# Patient Record
Sex: Female | Born: 1995 | Race: Black or African American | Hispanic: No | Marital: Single | Smoking: Former smoker
Health system: Southern US, Community
[De-identification: ages and names within clinical notes are randomized; demographics above are authoritative.]

## PROBLEM LIST (undated history)

## (undated) ENCOUNTER — Inpatient Hospital Stay (HOSPITAL_COMMUNITY): Payer: Self-pay

## (undated) DIAGNOSIS — O2303 Infections of kidney in pregnancy, third trimester: Secondary | ICD-10-CM

## (undated) DIAGNOSIS — O409XX0 Polyhydramnios, unspecified trimester, not applicable or unspecified: Secondary | ICD-10-CM

## (undated) DIAGNOSIS — E1065 Type 1 diabetes mellitus with hyperglycemia: Secondary | ICD-10-CM

## (undated) DIAGNOSIS — IMO0002 Reserved for concepts with insufficient information to code with codable children: Secondary | ICD-10-CM

## (undated) DIAGNOSIS — R011 Cardiac murmur, unspecified: Secondary | ICD-10-CM

## (undated) DIAGNOSIS — I10 Essential (primary) hypertension: Secondary | ICD-10-CM

## (undated) DIAGNOSIS — G43909 Migraine, unspecified, not intractable, without status migrainosus: Secondary | ICD-10-CM

## (undated) DIAGNOSIS — J45909 Unspecified asthma, uncomplicated: Secondary | ICD-10-CM

## (undated) HISTORY — DX: Polyhydramnios, unspecified trimester, not applicable or unspecified: O40.9XX0

## (undated) HISTORY — DX: Essential (primary) hypertension: I10

## (undated) HISTORY — DX: Migraine, unspecified, not intractable, without status migrainosus: G43.909

## (undated) HISTORY — DX: Infections of kidney in pregnancy, third trimester: O23.03

---

## 2017-07-23 DIAGNOSIS — O26892 Other specified pregnancy related conditions, second trimester: Secondary | ICD-10-CM

## 2017-07-23 DIAGNOSIS — Z6791 Unspecified blood type, Rh negative: Secondary | ICD-10-CM | POA: Insufficient documentation

## 2017-10-07 ENCOUNTER — Encounter (HOSPITAL_COMMUNITY): Payer: Self-pay

## 2017-10-07 ENCOUNTER — Encounter (HOSPITAL_COMMUNITY): Payer: Self-pay | Admitting: *Deleted

## 2017-10-07 ENCOUNTER — Emergency Department (HOSPITAL_COMMUNITY)
Admission: EM | Admit: 2017-10-07 | Discharge: 2017-10-07 | Disposition: A | Discharge status: Other Acute Inpatient Hospital | Payer: Medicaid Other | Attending: Emergency Medicine | Admitting: Emergency Medicine

## 2017-10-07 ENCOUNTER — Other Ambulatory Visit: Payer: Self-pay

## 2017-10-07 ENCOUNTER — Inpatient Hospital Stay (HOSPITAL_COMMUNITY)
Admission: AD | Admit: 2017-10-07 | Discharge: 2017-10-09 | Disposition: A | Discharge status: Home / Self Care | Payer: Medicaid Other | Source: Ambulatory Visit | Attending: Obstetrics and Gynecology | Admitting: Obstetrics and Gynecology

## 2017-10-07 DIAGNOSIS — O2340 Unspecified infection of urinary tract in pregnancy, unspecified trimester: Secondary | ICD-10-CM | POA: Diagnosis present

## 2017-10-07 DIAGNOSIS — E1065 Type 1 diabetes mellitus with hyperglycemia: Secondary | ICD-10-CM

## 2017-10-07 DIAGNOSIS — Z6791 Unspecified blood type, Rh negative: Secondary | ICD-10-CM

## 2017-10-07 DIAGNOSIS — O9989 Other specified diseases and conditions complicating pregnancy, childbirth and the puerperium: Secondary | ICD-10-CM | POA: Insufficient documentation

## 2017-10-07 DIAGNOSIS — Z363 Encounter for antenatal screening for malformations: Secondary | ICD-10-CM

## 2017-10-07 DIAGNOSIS — O24919 Unspecified diabetes mellitus in pregnancy, unspecified trimester: Secondary | ICD-10-CM

## 2017-10-07 DIAGNOSIS — O99212 Obesity complicating pregnancy, second trimester: Secondary | ICD-10-CM

## 2017-10-07 DIAGNOSIS — O24012 Pre-existing diabetes mellitus, type 1, in pregnancy, second trimester: Secondary | ICD-10-CM

## 2017-10-07 DIAGNOSIS — O99511 Diseases of the respiratory system complicating pregnancy, first trimester: Secondary | ICD-10-CM | POA: Insufficient documentation

## 2017-10-07 DIAGNOSIS — IMO0001 Reserved for inherently not codable concepts without codable children: Secondary | ICD-10-CM | POA: Diagnosis present

## 2017-10-07 DIAGNOSIS — Z3A24 24 weeks gestation of pregnancy: Secondary | ICD-10-CM

## 2017-10-07 DIAGNOSIS — O26899 Other specified pregnancy related conditions, unspecified trimester: Secondary | ICD-10-CM

## 2017-10-07 DIAGNOSIS — E1010 Type 1 diabetes mellitus with ketoacidosis without coma: Secondary | ICD-10-CM | POA: Diagnosis present

## 2017-10-07 DIAGNOSIS — O99210 Obesity complicating pregnancy, unspecified trimester: Secondary | ICD-10-CM | POA: Diagnosis present

## 2017-10-07 DIAGNOSIS — G43909 Migraine, unspecified, not intractable, without status migrainosus: Secondary | ICD-10-CM | POA: Diagnosis present

## 2017-10-07 DIAGNOSIS — E1069 Type 1 diabetes mellitus with other specified complication: Secondary | ICD-10-CM

## 2017-10-07 DIAGNOSIS — O0990 Supervision of high risk pregnancy, unspecified, unspecified trimester: Secondary | ICD-10-CM

## 2017-10-07 DIAGNOSIS — O409XX0 Polyhydramnios, unspecified trimester, not applicable or unspecified: Secondary | ICD-10-CM | POA: Diagnosis present

## 2017-10-07 HISTORY — DX: Cardiac murmur, unspecified: R01.1

## 2017-10-07 HISTORY — DX: Type 1 diabetes mellitus with hyperglycemia: E10.65

## 2017-10-07 HISTORY — DX: Reserved for concepts with insufficient information to code with codable children: IMO0002

## 2017-10-07 HISTORY — DX: Unspecified asthma, uncomplicated: J45.909

## 2017-10-07 LAB — CBC
HEMATOCRIT: 35.9 % — AB (ref 36.0–46.0)
Hemoglobin: 11.9 g/dL — ABNORMAL LOW (ref 12.0–15.0)
MCH: 29.5 pg (ref 26.0–34.0)
MCHC: 33.1 g/dL (ref 30.0–36.0)
MCV: 88.9 fL (ref 78.0–100.0)
Platelets: 229 10*3/uL (ref 150–400)
RBC: 4.04 MIL/uL (ref 3.87–5.11)
RDW: 12.7 % (ref 11.5–15.5)
WBC: 9.1 10*3/uL (ref 4.0–10.5)

## 2017-10-07 LAB — URINALYSIS, ROUTINE W REFLEX MICROSCOPIC
BILIRUBIN URINE: NEGATIVE
Glucose, UA: >=500 mg/dL — AB
Hgb urine dipstick: NEGATIVE
KETONES UR: 80 mg/dL — AB
LEUKOCYTES UA: NEGATIVE
Nitrite: NEGATIVE
Protein, ur: NEGATIVE mg/dL
SPECIFIC GRAVITY, URINE: 1.03 (ref 1.005–1.030)
pH: 6 (ref 5.0–8.0)

## 2017-10-07 LAB — CBG MONITORING, ED
GLUCOSE-CAPILLARY: 283 mg/dL — AB (ref 70–99)
Glucose-Capillary: 336 mg/dL — ABNORMAL HIGH (ref 70–99)

## 2017-10-07 LAB — GLUCOSE, CAPILLARY
GLUCOSE-CAPILLARY: 163 mg/dL — AB (ref 70–99)
GLUCOSE-CAPILLARY: 150 mg/dL — AB (ref 70–99)
GLUCOSE-CAPILLARY: 151 mg/dL — AB (ref 70–99)

## 2017-10-07 LAB — BASIC METABOLIC PANEL
Anion gap: 11 (ref 5–15)
BUN: 10 mg/dL (ref 6–20)
CO2: 20 mmol/L — AB (ref 22–32)
Calcium: 8.6 mg/dL — ABNORMAL LOW (ref 8.9–10.3)
Chloride: 103 mmol/L (ref 98–111)
Creatinine, Ser: 0.59 mg/dL (ref 0.44–1.00)
GFR calc Af Amer: >60 mL/min (ref >60)
GFR calc non Af Amer: >60 mL/min (ref >60)
GLUCOSE: 354 mg/dL — AB (ref 70–99)
POTASSIUM: 4 mmol/L (ref 3.5–5.1)
Sodium: 134 mmol/L — ABNORMAL LOW (ref 135–145)

## 2017-10-07 LAB — I-STAT BETA HCG BLOOD, ED (MC, WL, AP ONLY)

## 2017-10-07 MED ORDER — INSULIN ASPART 100 UNIT/ML ~~LOC~~ SOLN
0.0000 [IU] | Freq: Three times a day (TID) | SUBCUTANEOUS | Status: DC
  Administered 2017-10-07: 4 [IU] via SUBCUTANEOUS
  Administered 2017-10-08 (×3): 3 [IU] via SUBCUTANEOUS
  Administered 2017-10-09: 5 [IU] via SUBCUTANEOUS

## 2017-10-07 MED ORDER — SODIUM CHLORIDE 0.9 % IV BOLUS
1000.0000 mL | Freq: Once | INTRAVENOUS | Status: AC
  Administered 2017-10-07: 1000 mL via INTRAVENOUS

## 2017-10-07 MED ORDER — CALCIUM CARBONATE ANTACID 500 MG PO CHEW: 2.0000 | CHEWABLE_TABLET | ORAL | Status: DC | PRN

## 2017-10-07 MED ORDER — SODIUM CHLORIDE 0.9 % IV SOLN
INTRAVENOUS | Status: DC
  Administered 2017-10-08: 02:00:00 via INTRAVENOUS

## 2017-10-07 MED ORDER — INSULIN GLARGINE 100 UNIT/ML ~~LOC~~ SOLN
28.0000 [IU] | Freq: Every day | SUBCUTANEOUS | Status: DC
  Administered 2017-10-07 – 2017-10-08 (×2): 28 [IU] via SUBCUTANEOUS
  Filled 2017-10-07 (×3): qty 0.28

## 2017-10-07 MED ORDER — PRENATAL MULTIVITAMIN CH
1.0000 | ORAL_TABLET | Freq: Every day | ORAL | Status: DC
  Administered 2017-10-08 – 2017-10-09 (×2): 1 via ORAL
  Filled 2017-10-07 (×2): qty 1

## 2017-10-07 MED ORDER — INSULIN ASPART 100 UNIT/ML ~~LOC~~ SOLN: 0.0000 [IU] | Freq: Three times a day (TID) | SUBCUTANEOUS | Status: DC

## 2017-10-07 MED ORDER — DOCUSATE SODIUM 100 MG PO CAPS: 100.0000 mg | ORAL_CAPSULE | Freq: Every day | ORAL | Status: DC

## 2017-10-07 MED ORDER — INSULIN ASPART 100 UNIT/ML ~~LOC~~ SOLN
8.0000 [IU] | Freq: Once | SUBCUTANEOUS | Status: AC
  Administered 2017-10-07: 8 [IU] via INTRAVENOUS
  Filled 2017-10-07: qty 1

## 2017-10-07 MED ORDER — ACETAMINOPHEN 325 MG PO TABS: 650.0000 mg | ORAL_TABLET | ORAL | Status: DC | PRN

## 2017-10-07 NOTE — Progress Notes (Signed)
Patient called nurse into room due to feeling weak and shakey. Checked blood sugar and it was 150. Will reassess patient in a hour to see if theres any change.

## 2017-10-07 NOTE — ED Provider Notes (Signed)
[facility] EMERGENCY DEPARTMENT Provider Note   CSN: [id number] Arrival date & time: 10/07/17  1353     History   Chief Complaint Chief Complaint  Patient presents with  . Hyperglycemia    HPI [name] [name] is a 22 y.o. female.  Pt presents to the ED today with elevated blood sugars and pregnancy.  Pt has been a type 1 diabetic for several years.  This is her first pregnancy.  She is [redacted] weeks along.  She has had poor control throughout her pregnancy per notes in Epic.  She is followed at [facility] as she used to live in that area.  She has recently moved to [place] and does not have an obgyn here.  The pt denies any abdominal pain or vaginal bleeding.  She is feeling her baby move.  She does have an endocrinologist at [facility], but last saw the endocrinologist in May.     Past Medical History:  Diagnosis Date  . Asthma   . Diabetes type 1, uncontrolled ([place])   . Heart murmur     There are no active problems to display for this patient.   History reviewed. No pertinent surgical history.   OB History    Gravida  1   Para      Term      Preterm      AB      Living        SAB      TAB      Ectopic      Multiple      Live Births               Home Medications    Prior to Admission medications   Not on File    Family History History reviewed. No pertinent family history.  Social History Social History   Tobacco Use  . Smoking status: Never Smoker  . Smokeless tobacco: Never Used  Substance Use Topics  . Alcohol use: Not on file  . Drug use: Not on file     Allergies   Patient has no known allergies.   Review of Systems Review of Systems  Endocrine: Positive for polydipsia and polyuria.  All other systems reviewed and are negative.    Physical Exam Updated Vital Signs BP 125/68   Pulse 92   Temp 99 F (37.2 C) (Oral)   Resp 18   SpO2 99%   Physical Exam  Constitutional: She is oriented to person, place,  and time. She appears well-developed and well-nourished.  HENT:  Head: Normocephalic and atraumatic.  Right Ear: External ear normal.  Left Ear: External ear normal.  Nose: Nose normal.  Mouth/Throat: Mucous membranes are dry.  Eyes: Pupils are equal, round, and reactive to light. Conjunctivae and EOM are normal.  Neck: Normal range of motion. Neck supple.  Cardiovascular: Normal rate, regular rhythm, normal heart sounds and intact distal pulses.  Pulmonary/Chest: Effort normal and breath sounds normal.  Abdominal: Soft. Bowel sounds are normal.  Gravid abdomen  Musculoskeletal: Normal range of motion.  Neurological: She is alert and oriented to person, place, and time.  Skin: Skin is warm. Capillary refill takes less than 2 seconds.  Psychiatric: She has a normal mood and affect. Her behavior is normal. Judgment and thought content normal.  Nursing note and vitals reviewed.    ED Treatments / Results  Labs (all labs ordered are listed, but only abnormal results are displayed) Labs Reviewed  BASIC METABOLIC  PANEL - Abnormal; Notable for the following components:      Result Value   Sodium 134 (*)    CO2 20 (*)    Glucose, Bld 354 (*)    Calcium 8.6 (*)    All other components within normal limits  CBC - Abnormal; Notable for the following components:   Hemoglobin 11.9 (*)    HCT 35.9 (*)    All other components within normal limits  URINALYSIS, ROUTINE W REFLEX MICROSCOPIC - Abnormal; Notable for the following components:   Glucose, UA >=500 (*)    Ketones, ur 80 (*)    Bacteria, UA RARE (*)    All other components within normal limits  CBG MONITORING, ED - Abnormal; Notable for the following components:   Glucose-Capillary 336 (*)    All other components within normal limits  I-STAT BETA HCG BLOOD, ED ([facility], [facility], AP ONLY) - Abnormal; Notable for the following components:   I-stat hCG, quantitative >2,000.0 (*)    All other components within normal limits  CBG MONITORING,  ED    EKG None  Radiology No results found.  Procedures Procedures (including critical care time)  Medications Ordered in ED Medications  sodium chloride 0.9 % bolus 1,000 mL (has no administration in time range)  insulin aspart (novoLOG) injection 8 Units (has no administration in time range)     Initial Impression / Assessment and Plan / ED Course  I have reviewed the triage vital signs and the nursing notes.  Pertinent labs & imaging results that were available during my care of the patient were reviewed by me and considered in my medical decision making (see chart for details).   Pt given IVFs and IV insulin.  Rapid OB response called and OB nurse came to see pt.  I spoke with Dr. Rosana Hoes (OBGyn) who recommends transfer to [facility] to get her blood sugar stabilized and to get her hooked up with outpatient follow up here in [place].  Pt is stable for transfer.  Final Clinical Impressions(s) / ED Diagnoses   Final diagnoses:  Poorly controlled type 1 diabetes mellitus ([place])  [redacted] weeks gestation of pregnancy    ED Discharge Orders    None       Isla Pence, MD 10/07/17 [phone]

## 2017-10-07 NOTE — ED Notes (Signed)
Rapid response OB nurse consulted to see if fetal monitoring necessary

## 2017-10-07 NOTE — ED Triage Notes (Addendum)
Pt in stating her sugar levels have been reading higher at home, 200-500 range, pt has type one diabetes, reports she was feeling dehydrated last week and has been trying to control her sugars at home without success. Pt is 6 months pregnant, denies abdominal pain, reports increased vaginal discharge, feeling normal fetal movement

## 2017-10-07 NOTE — H&P (Signed)
Obstetric History and Physical  [name] [name] is a 22 y.o. G1P0 with IUP at [redacted]w[redacted]d presenting for dehydration and elevated blood glucose. Patient states she has been having  none contractions, none vaginal bleeding, intact membranes, with active fetal movement.  H/o T1DM, not well controlled. H/o DKA "a long time ago." Reports she has had insulin regimen change several times in pregnancy and blood glucose has not been well controlled.   Patient reports she has been dehydrated for several days with very high blood glucose. Denies nausea/vomiting. Has had diarrhea for 4-5 days.  Dated by 1st trim [place]. Reports anatomy US and fetal echo were normal.   Takes  28 units lantus at night Sliding scale novolog with meals  carb ratio is 1:8 Has been doing 10 units or more with each meal  07/23/17 HA1c: 10.1 (care everywhere)    Prenatal Course Source of Care: [facility], last seen there 6/62/94 Pregnancy complications or risks: Patient Active Problem List   Diagnosis Date Noted  . DM (diabetes mellitus), type 1 ([place]) 10/07/2017  . Supervision of high risk pregnancy, antepartum 10/07/2017    Medical History:  Past Medical History:  Diagnosis Date  . Asthma   . Diabetes type 1, uncontrolled ([place])   . Heart murmur     No past surgical history on file.  OB History  Gravida Para Term Preterm AB Living  1            SAB TAB Ectopic Multiple Live Births               # Outcome Date GA Lbr Len/2nd Weight Sex Delivery Anes PTL Lv  1 Current             Social History   Socioeconomic History  . Marital status: Single    Spouse name: Not on file  . Number of children: Not on file  . Years of education: Not on file  . Highest education level: Not on file  Occupational History  . Not on file  Social Needs  . Financial resource strain: Not on file  . Food insecurity:    Worry: Not on file    Inability: Not on file  . Transportation needs:    Medical: Not on file    Non-medical: Not  on file  Tobacco Use  . Smoking status: Never Smoker  . Smokeless tobacco: Never Used  Substance and Sexual Activity  . Alcohol use: Not on file  . Drug use: Not on file  . Sexual activity: Not on file  Lifestyle  . Physical activity:    Days per week: Not on file    Minutes per session: Not on file  . Stress: Not on file  Relationships  . Social connections:    Talks on phone: Not on file    Gets together: Not on file    Attends religious service: Not on file    Active member of club or organization: Not on file    Attends meetings of clubs or organizations: Not on file    Relationship status: Not on file  Other Topics Concern  . Not on file  Social History Narrative  . Not on file    No family history on file.  No medications prior to admission.    Allergies  Allergen Reactions  . Banana Itching and Swelling    Review of Systems: Negative except for what is mentioned in HPI.  Physical Exam: There were no vitals taken for  this visit. CONSTITUTIONAL: Well-developed, well-nourished female in no acute distress.  HENT:  Normocephalic, atraumatic, External right and left ear normal. Oropharynx is clear and moist EYES: Conjunctivae and EOM are normal. Pupils are equal, round, and reactive to light. No scleral icterus.  NECK: Normal range of motion, supple, no masses SKIN: Skin is warm and dry. No rash noted. Not diaphoretic. No erythema. No pallor. NEUROLOGIC: Alert and oriented to person, place, and time. Normal reflexes, muscle tone coordination. No cranial nerve deficit noted. PSYCHIATRIC: Normal mood and affect. Normal behavior. Normal judgment and thought content. CARDIOVASCULAR: Normal heart rate noted, regular rhythm RESPIRATORY: Effort and breath sounds normal, no problems with respiration noted ABDOMEN: Soft, nontender, nondistended, gravid. MUSCULOSKELETAL: Normal range of motion. No edema and no tenderness. 2+ distal pulses.  Cervical Exam: deferred FHT:  155 bpm   Pertinent Labs/Studies:   Results for orders placed or performed during the hospital encounter of 10/07/17 (from the past 24 hour(s))  CBG monitoring, ED     Status: Abnormal   Collection Time: 10/07/17  2:00 PM  Result Value Ref Range   Glucose-Capillary 336 (H) 70 - 99 mg/dL  I-Stat beta hCG blood, ED     Status: Abnormal   Collection Time: 10/07/17  2:19 PM  Result Value Ref Range   I-stat hCG, quantitative >2,000.0 (H) <5 mIU/mL   Comment 3          Basic metabolic panel     Status: Abnormal   Collection Time: 10/07/17  2:50 PM  Result Value Ref Range   Sodium 134 (L) 135 - 145 mmol/L   Potassium 4.0 3.5 - 5.1 mmol/L   Chloride 103 98 - 111 mmol/L   CO2 20 (L) 22 - 32 mmol/L   Glucose, Bld 354 (H) 70 - 99 mg/dL   BUN 10 6 - 20 mg/dL   Creatinine, Ser 0.59 0.44 - 1.00 mg/dL   Calcium 8.6 (L) 8.9 - 10.3 mg/dL   GFR calc non Af Amer >60 >60 mL/min   GFR calc Af Amer >60 >60 mL/min   Anion gap 11 5 - 15  CBC     Status: Abnormal   Collection Time: 10/07/17  2:50 PM  Result Value Ref Range   WBC 9.1 4.0 - 10.5 K/uL   RBC 4.04 3.87 - 5.11 MIL/uL   Hemoglobin 11.9 (L) 12.0 - 15.0 g/dL   HCT 35.9 (L) 36.0 - 46.0 %   MCV 88.9 78.0 - 100.0 fL   MCH 29.5 26.0 - 34.0 pg   MCHC 33.1 30.0 - 36.0 g/dL   RDW 12.7 11.5 - 15.5 %   Platelets 229 150 - 400 K/uL  Urinalysis, Routine w reflex microscopic     Status: Abnormal   Collection Time: 10/07/17  3:32 PM  Result Value Ref Range   Color, Urine YELLOW YELLOW   APPearance CLEAR CLEAR   Specific Gravity, Urine 1.030 1.005 - 1.030   pH 6.0 5.0 - 8.0   Glucose, UA >=500 (A) NEGATIVE mg/dL   Hgb urine dipstick NEGATIVE NEGATIVE   Bilirubin Urine NEGATIVE NEGATIVE   Ketones, ur 80 (A) NEGATIVE mg/dL   Protein, ur NEGATIVE NEGATIVE mg/dL   Nitrite NEGATIVE NEGATIVE   Leukocytes, UA NEGATIVE NEGATIVE   RBC / HPF 0-5 0 - 5 RBC/hpf   WBC, UA 0-5 0 - 5 WBC/hpf   Bacteria, UA RARE (A) NONE SEEN   Squamous Epithelial / LPF 0-5  0 - 5   Mucus PRESENT  POC CBG, ED     Status: Abnormal   Collection Time: 10/07/17  4:42 PM  Result Value Ref Range   Glucose-Capillary 283 (H) 70 - 99 mg/dL   Comment 1 Notify RN    Comment 2 Document in Chart     Assessment : [name] is a 22 y.o. G1P0 at [redacted]w[redacted]d being admitted for management of blood glucose for T1DM with blood glucose up to 350s. With diarrhea for 4-5 days. H/o of poorly controlled T1DM with most recent A1C 10.1.   Plan:  T1DM - NS @ 125 mL/hr - will start home regimen and check CBGs - diabetic diet  FWB  - normal FHT   Reviewed with patient that she will need to establish care with OB/GYN in [place].   Feliz Beam, M.D. [facility]  10/07/2017, 7:07 PM

## 2017-10-07 NOTE — Progress Notes (Signed)
Dr Rosana Hoes called and notified that pt presented as a type 1 diabetic who feels hyperglycemic and dehydrated.  Dr Gilford Raid and Dr Rosana Hoes discuss plan of care on the phone and decision is made to transport to [facility] for inpatient diabetic management. 3rd floor called and given report

## 2017-10-08 ENCOUNTER — Inpatient Hospital Stay (HOSPITAL_BASED_OUTPATIENT_CLINIC_OR_DEPARTMENT_OTHER): Payer: Medicaid Other

## 2017-10-08 DIAGNOSIS — O26899 Other specified pregnancy related conditions, unspecified trimester: Secondary | ICD-10-CM

## 2017-10-08 DIAGNOSIS — Z3A24 24 weeks gestation of pregnancy: Secondary | ICD-10-CM | POA: Diagnosis not present

## 2017-10-08 DIAGNOSIS — O24012 Pre-existing diabetes mellitus, type 1, in pregnancy, second trimester: Secondary | ICD-10-CM | POA: Diagnosis not present

## 2017-10-08 DIAGNOSIS — E1010 Type 1 diabetes mellitus with ketoacidosis without coma: Secondary | ICD-10-CM | POA: Diagnosis present

## 2017-10-08 DIAGNOSIS — Z363 Encounter for antenatal screening for malformations: Secondary | ICD-10-CM

## 2017-10-08 DIAGNOSIS — O2340 Unspecified infection of urinary tract in pregnancy, unspecified trimester: Secondary | ICD-10-CM | POA: Diagnosis present

## 2017-10-08 DIAGNOSIS — Z6791 Unspecified blood type, Rh negative: Secondary | ICD-10-CM

## 2017-10-08 DIAGNOSIS — O99212 Obesity complicating pregnancy, second trimester: Secondary | ICD-10-CM

## 2017-10-08 DIAGNOSIS — G43909 Migraine, unspecified, not intractable, without status migrainosus: Secondary | ICD-10-CM

## 2017-10-08 DIAGNOSIS — O99210 Obesity complicating pregnancy, unspecified trimester: Secondary | ICD-10-CM | POA: Diagnosis present

## 2017-10-08 HISTORY — DX: Migraine, unspecified, not intractable, without status migrainosus: G43.909

## 2017-10-08 LAB — URINALYSIS, ROUTINE W REFLEX MICROSCOPIC
Bilirubin Urine: NEGATIVE
GLUCOSE, UA: NEGATIVE mg/dL
HGB URINE DIPSTICK: NEGATIVE
Ketones, ur: 80 mg/dL — AB
Nitrite: POSITIVE — AB
Protein, ur: NEGATIVE mg/dL
SPECIFIC GRAVITY, URINE: 1.013 (ref 1.005–1.030)
pH: 5 (ref 5.0–8.0)

## 2017-10-08 LAB — BASIC METABOLIC PANEL
Anion gap: 8 (ref 5–15)
BUN: 9 mg/dL (ref 6–20)
CALCIUM: 8 mg/dL — AB (ref 8.9–10.3)
CO2: 19 mmol/L — ABNORMAL LOW (ref 22–32)
CREATININE: 0.48 mg/dL (ref 0.44–1.00)
Chloride: 109 mmol/L (ref 98–111)
GFR calc non Af Amer: >60 mL/min (ref >60)
Glucose, Bld: 142 mg/dL — ABNORMAL HIGH (ref 70–99)
Potassium: 3.5 mmol/L (ref 3.5–5.1)
SODIUM: 136 mmol/L (ref 135–145)

## 2017-10-08 LAB — GLUCOSE, CAPILLARY
GLUCOSE-CAPILLARY: 126 mg/dL — AB (ref 70–99)
GLUCOSE-CAPILLARY: 140 mg/dL — AB (ref 70–99)
GLUCOSE-CAPILLARY: 208 mg/dL — AB (ref 70–99)
Glucose-Capillary: 129 mg/dL — ABNORMAL HIGH (ref 70–99)
Glucose-Capillary: 163 mg/dL — ABNORMAL HIGH (ref 70–99)
Glucose-Capillary: 129 mg/dL — ABNORMAL HIGH (ref 70–99)
Glucose-Capillary: 176 mg/dL — ABNORMAL HIGH (ref 70–99)

## 2017-10-08 LAB — HEMOGLOBIN A1C
HEMOGLOBIN A1C: 8.3 % — AB (ref 4.8–5.6)
MEAN PLASMA GLUCOSE: 191.51 mg/dL

## 2017-10-08 LAB — BETA-HYDROXYBUTYRIC ACID: BETA-HYDROXYBUTYRIC ACID: 0.58 mmol/L — AB (ref 0.05–0.27)

## 2017-10-08 MED ORDER — NITROFURANTOIN MONOHYD MACRO 100 MG PO CAPS
100.0000 mg | ORAL_CAPSULE | Freq: Two times a day (BID) | ORAL | Status: DC
  Administered 2017-10-08 – 2017-10-09 (×3): 100 mg via ORAL
  Filled 2017-10-08 (×5): qty 1

## 2017-10-08 MED ORDER — DOCUSATE SODIUM 100 MG PO CAPS: 100.0000 mg | ORAL_CAPSULE | Freq: Two times a day (BID) | ORAL | Status: DC | PRN

## 2017-10-08 MED ORDER — INSULIN ASPART 100 UNIT/ML ~~LOC~~ SOLN
0.0000 [IU] | Freq: Three times a day (TID) | SUBCUTANEOUS | Status: DC | PRN
  Administered 2017-10-08: 3 [IU] via SUBCUTANEOUS
  Administered 2017-10-08 – 2017-10-09 (×3): 5 [IU] via SUBCUTANEOUS
  Filled 2017-10-08 (×3): qty 0.11

## 2017-10-08 MED ORDER — LACTATED RINGERS IV BOLUS
1000.0000 mL | Freq: Once | INTRAVENOUS | Status: AC
  Administered 2017-10-08: 1000 mL via INTRAVENOUS

## 2017-10-08 MED ORDER — ASPIRIN 81 MG PO CHEW
81.0000 mg | CHEWABLE_TABLET | Freq: Every day | ORAL | Status: DC
  Administered 2017-10-08 – 2017-10-09 (×2): 81 mg via ORAL
  Filled 2017-10-08 (×3): qty 1

## 2017-10-08 NOTE — Progress Notes (Signed)
Pt has urine culture in progress sent from lab

## 2017-10-08 NOTE — Progress Notes (Signed)
Daily Antepartum Note  Admission Date: 10/07/2017 Current Date: 10/08/2017 9:25 AM  [name] [name] is a 22 y.o. G1 @ [redacted]w[redacted]d, HD#2, admitted for DM1 control.  Pregnancy complicated by: Patient Active Problem List   Diagnosis Date Noted  . Rh negative state in antepartum period 10/08/2017  . Migraines 10/08/2017  . Severe obesity (BMI 35.0-39.9) with comorbidity ([place]) 10/08/2017  . Obesity in pregnancy 10/08/2017  . DM (diabetes mellitus), type 1 ([place]) 10/07/2017  . Supervision of high risk pregnancy, antepartum 10/07/2017    Overnight/24hr events:  none  Subjective:  Feeling better.   Objective:    Current Vital Signs 24h Vital Sign Ranges  T 98.6 F (37 C) Temp  Avg: 98.7 F (37.1 C)  Min: 98 F (36.7 C)  Max: 99.1 F (37.3 C)  BP (!) 104/57 BP  Min: 101/52  Max: 133/83  HR 93 Pulse  Avg: 93.4  Min: 87  Max: 105  RR 15 Resp  Avg: 18.5  Min: 15  Max: 25  SaO2 99 % Room Air SpO2  Avg: 98.7 %  Min: 98 %  Max: 100 %       24 Hour I/O Current Shift I/O  Time Ins Outs No intake/output data recorded. No intake/output data recorded.    Physical exam: General: Well nourished, well developed female in no acute distress. Abdomen: gravid, nttp Cardiovascular: S1, S2 normal, no murmur, rub or gallop, regular rate and rhythm Respiratory: CTAB Extremities: no clubbing, cyanosis or edema Skin: Warm and dry.   Medications: Current Facility-Administered Medications  Medication Dose Route Frequency Provider Last Rate Last Dose  . 0.9 %  sodium chloride infusion   Intravenous Continuous Sloan Leiter, MD 125 mL/hr at 10/08/17 0141    . acetaminophen (TYLENOL) tablet 650 mg  650 mg Oral Q4H PRN Sloan Leiter, MD      . calcium carbonate (TUMS - dosed in mg elemental calcium) chewable tablet 400 mg of elemental calcium  2 tablet Oral Q4H PRN Sloan Leiter, MD      . docusate sodium (COLACE) capsule 100 mg  100 mg Oral BID PRN Aletha Halim, MD      . insulin aspart (novoLOG)  injection 0-16 Units  0-16 Units Subcutaneous TID WC Sloan Leiter, MD   3 Units at 10/08/17 0902  . insulin glargine (LANTUS) injection 28 Units  28 Units Subcutaneous QHS Sloan Leiter, MD   28 Units at 10/07/17 2240  . prenatal multivitamin tablet 1 tablet  1 tablet Oral Q1200 Sloan Leiter, MD        Labs:  Recent Labs  Lab 10/07/17 1450  WBC 9.1  HGB 11.9*  HCT 35.9*  PLT 229    Recent Labs  Lab 10/07/17 1450  NA 134*  K 4.0  CL 103  CO2 20*  BUN 10  CREATININE 0.59  CALCIUM 8.6*  GLUCOSE 354*    Results for [name], [name] (MRN [id number]) as of 10/08/2017 09:21  Ref. Range 10/07/2017 21:39 10/07/2017 22:19 10/08/2017 06:43 10/08/2017 08:49 10/08/2017 08:58  Glucose-Capillary Latest Ref Range: 70 - 99 mg/dL 151 (H) 150 (H) 129 (H)  126 (H)   Radiology: no new imaging  Assessment & Plan:  Pt improved *Pregnancy: routine care. Add on low dose asa. F/u qday nst *DM1: better controlled. DM education consulted. Continue with achs BS checks and 2hr post prandial Pt states she was taking lantus 28qhs and novolog carb counting and usually 10 per meal. Pt told  to call [facility] endo for f/u appt this month. Will transfer her ob care to [place] since pt states she's moved here. Will check u/a, bmp and betahydroxybutyrate.  *Preterm: no issues *PPx: scds *FEN/GI: DM diet *Dispo: possibly today  Durene Romans MD Attending Center for [facility])

## 2017-10-08 NOTE — Plan of Care (Signed)
  Problem: Education: Goal: Knowledge of disease or condition will improve Outcome: Progressing   Problem: Pain Management: Goal: Relief or control of pain will improve Outcome: Progressing

## 2017-10-08 NOTE — Progress Notes (Signed)
Inpatient Diabetes Program Recommendations  AACE/ADA: New Consensus Statement on Inpatient Glycemic Control (2015)  Target Ranges:  Prepandial:   less than 140 mg/dL      Peak postprandial:   less than 180 mg/dL (1-2 hours)      Critically ill patients:  140 - 180 mg/dL   Lab Results  Component Value Date   GLUCAP 126 (H) 10/08/2017    Review of Glycemic Control Results for [name], [name] (MRN [id number]) as of 10/08/2017 09:41  Ref. Range 10/07/2017 21:39 10/07/2017 22:19 10/08/2017 06:43 10/08/2017 08:58  Glucose-Capillary Latest Ref Range: 70 - 99 mg/dL 151 (H) 150 (H) 129 (H) 126 (H)   Diabetes history: Type 1 DM Outpatient Diabetes medications: Lantus 28 units QHS, Novolog per SSI TID, Novolog per 1:8 CHO ratio TID Current orders for Inpatient glycemic control: Lantus 28 units QHS, Novolog 0-16 units TID  Inpatient Diabetes Program Recommendations:    Spoke with patient regarding outpatient diabetes management. Patient was diagnosed 12 years ago with Type 1 DM. Has been followed by [facility] endocrinology, with last appointment being on 08/14/17 with next appointment scheduled for 10/15/17. Patient reports administering insulin as prescribed and even takes correction insulin when not eating. Denies missing doses. Patient reports checking BS 4-5 times per day and denies frequent periods of hypoglycemia.   Reviewed with patient importance of good glucose control @ home, and blood sugar goals, especially during pregnancy. Educated on patho of DM in pregnancy, need for additional insulin, hormonal changes that occur from placenta, increase in insulin resistance and target goals for BS range. Reviewed hyperinsulinemia in neonate, risk for admission to NICU given admitting BS, and reiterated need for improved glycemic control.   Discussed need to follow up with endocrinology. Patient plans to go to next appointment and denies barriers. Has needed supplies and insulin in preparation for discharge.  Reviewed with patient the need to bring meter, journal, if necessary explaining daily routine vs BS log, and when to communicate with MD.   Will plan to place outpatient education and referral to nutrition and diabetes center, despite patient's multiple outpatient visits with CDE, and nutritionists. Feel it would be beneficial for patient to have [employer]. Educated on tool briefly, specifics to cost, benefits, and how to use as aide to establishing improved control.  Reviewed importance of taking insulin as prescribed and reviewed current impatient blood sugars, given the same insulin doses as home regimen. Educated patient to expect the insulin needs to increase as gestation continues. Patient denies further questions or needs at this time.   Thanks, Bronson Curb, MSN, RNC-OB Diabetes Coordinator [phone] (8a-5p)

## 2017-10-08 NOTE — Progress Notes (Addendum)
BS 208. Pt only got 3 units of insulin meal coverage and carb counting not in orders. SSI for 2 hour post prandial put in and will give patient 5 units now. I also instructed the RN to give her meal coverage insulin as SSI + carb counting (1unit insulin: 8unit carbs). Labs with still urine ketones but beta hydroxy level low and AG normal. Will keep patient overnight and given another liter for hydration purposes. Also will start pt on macrobid and pt told to leave urine sample for urine culture due to + nitrites in u/a sample.  Durene Romans MD Attending Center for [employer] [facility] (Faculty Practice) 10/08/2017 Time: [phone]

## 2017-10-08 NOTE — Progress Notes (Signed)
I spent time with [name] [name] and [name] [name].  They are excited about expecting a baby, but worried about being here at 24 weeks.  [name] [name] in particular shared about her fears about her fluctuating sugars.  She said it has always been that way, despite diet and insulin, but now she is concerned about how this will affect her baby.  She is also concerned about him having diabetes.  She shared about some of what she has experienced with her own diabetes which she has had since age 22; and how it always made her feel different.  She does not want her baby to feel different and she is scared that her diabetes is still uncontrolled despite her efforts.  I offered listening support and will try to round on them as I am able, but please also page as needs arise.  [name] [name], Bcc Pager, [phone]    10/08/17 1500  Clinical Encounter Type  Visited With Patient and family together  Visit Type Spiritual support  Referral From Nurse  Spiritual Encounters  Spiritual Needs Emotional

## 2017-10-09 DIAGNOSIS — O409XX0 Polyhydramnios, unspecified trimester, not applicable or unspecified: Secondary | ICD-10-CM | POA: Diagnosis present

## 2017-10-09 HISTORY — DX: Polyhydramnios, unspecified trimester, not applicable or unspecified: O40.9XX0

## 2017-10-09 LAB — GLUCOSE, CAPILLARY
GLUCOSE-CAPILLARY: 113 mg/dL — AB (ref 70–99)
Glucose-Capillary: 120 mg/dL — ABNORMAL HIGH (ref 70–99)
Glucose-Capillary: 221 mg/dL — ABNORMAL HIGH (ref 70–99)
Glucose-Capillary: 231 mg/dL — ABNORMAL HIGH (ref 70–99)

## 2017-10-09 MED ORDER — ASPIRIN 81 MG PO CHEW: 81.0000 mg | CHEWABLE_TABLET | Freq: Every day | ORAL | 2 refills | Status: DC

## 2017-10-09 MED ORDER — SODIUM CHLORIDE 0.9% FLUSH: 3.0000 mL | Freq: Two times a day (BID) | INTRAVENOUS | Status: DC

## 2017-10-09 MED ORDER — NITROFURANTOIN MONOHYD MACRO 100 MG PO CAPS: 100.0000 mg | ORAL_CAPSULE | Freq: Two times a day (BID) | ORAL | 0 refills | Status: AC

## 2017-10-09 MED ORDER — INSULIN ASPART 100 UNIT/ML ~~LOC~~ SOLN: 0.0000 [IU] | Freq: Three times a day (TID) | SUBCUTANEOUS | 11 refills | Status: DC

## 2017-10-09 MED ORDER — INSULIN GLARGINE 100 UNIT/ML ~~LOC~~ SOLN: 28.0000 [IU] | Freq: Every day | SUBCUTANEOUS | 11 refills | Status: DC

## 2017-10-09 NOTE — Discharge Summary (Addendum)
Discharge Summary   Admit Date: 10/07/2017 Discharge Date: 10/09/2017 Discharging Service: Antepartum  Primary OBGYN: [facility] Admitting Physician: Sloan Leiter, MD  Discharge Physician: Ilda Basset  Referring Provider: [facility]  Primary Care Provider: Patient, No Pcp Per  Admission Diagnoses: *Pregnancy at 24/2 *DM1 *Concern for DKA *Poor patient compliance *Rh negative  Discharge Diagnoses: *Pregnancy at 24/4 *DM1 *UTI *Mild polyhydramnios *Rh negative   Consult Orders: CONSULT TO DIABETES COORDINATOR   Surgeries/Procedures Performed: None  History and Physical: Obstetric History and Physical  [name] [name] is a 22 y.o. G1P0 with IUP at [redacted]w[redacted]d presenting for dehydration and elevated blood glucose. Patient states she has been having  none contractions, none vaginal bleeding, intact membranes, with active fetal movement.  H/o T1DM, not well controlled. H/o DKA "a long time ago." Reports she has had insulin regimen change several times in pregnancy and blood glucose has not been well controlled.   Patient reports she has been dehydrated for several days with very high blood glucose. Denies nausea/vomiting. Has had diarrhea for 4-5 days.  Dated by 1st trim [place]. Reports anatomy US and fetal echo were normal.   Takes  28 units lantus at night Sliding scale novolog with meals  carb ratio is 1:8 Has been doing 10 units or more with each meal  07/23/17 HA1c: 10.1 (care everywhere)    Prenatal Course Source of Care: [facility], last seen there 1/44/81 Pregnancy complications or risks:     Patient Active Problem List   Diagnosis Date Noted  . DM (diabetes mellitus), type 1 ([place]) 10/07/2017  . Supervision of high risk pregnancy, antepartum 10/07/2017    Medical History:      Past Medical History:  Diagnosis Date  . Asthma   . Diabetes type 1, uncontrolled ([place])   . Heart murmur     No past surgical history on file.  OB History  Gravida Para Term  Preterm AB Living  1            SAB TAB Ectopic Multiple Live Births                     # Outcome Date GA Lbr Len/2nd Weight Sex Delivery Anes PTL Lv  1 Current             Social History        Socioeconomic History  . Marital status: Single    Spouse name: Not on file  . Number of children: Not on file  . Years of education: Not on file  . Highest education level: Not on file  Occupational History  . Not on file  Social Needs  . Financial resource strain: Not on file  . Food insecurity:    Worry: Not on file    Inability: Not on file  . Transportation needs:    Medical: Not on file    Non-medical: Not on file  Tobacco Use  . Smoking status: Never Smoker  . Smokeless tobacco: Never Used  Substance and Sexual Activity  . Alcohol use: Not on file  . Drug use: Not on file  . Sexual activity: Not on file  Lifestyle  . Physical activity:    Days per week: Not on file    Minutes per session: Not on file  . Stress: Not on file  Relationships  . Social connections:    Talks on phone: Not on file    Gets together: Not on file    Attends religious service:  Not on file    Active member of club or organization: Not on file    Attends meetings of clubs or organizations: Not on file    Relationship status: Not on file  Other Topics Concern  . Not on file  Social History Narrative  . Not on file    No family history on file.  No medications prior to admission.        Allergies  Allergen Reactions  . Banana Itching and Swelling    Review of Systems: Negative except for what is mentioned in HPI.  Physical Exam: There were no vitals taken for this visit. CONSTITUTIONAL: Well-developed, well-nourished female in no acute distress.  HENT:  Normocephalic, atraumatic, External right and left ear normal. Oropharynx is clear and moist EYES: Conjunctivae and EOM are normal. Pupils are equal, round, and reactive to light. No  scleral icterus.  NECK: Normal range of motion, supple, no masses SKIN: Skin is warm and dry. No rash noted. Not diaphoretic. No erythema. No pallor. NEUROLOGIC: Alert and oriented to person, place, and time. Normal reflexes, muscle tone coordination. No cranial nerve deficit noted. PSYCHIATRIC: Normal mood and affect. Normal behavior. Normal judgment and thought content. CARDIOVASCULAR: Normal heart rate noted, regular rhythm RESPIRATORY: Effort and breath sounds normal, no problems with respiration noted ABDOMEN: Soft, nontender, nondistended, gravid. MUSCULOSKELETAL: Normal range of motion. No edema and no tenderness. 2+ distal pulses.  Cervical Exam: deferred FHT: 155 bpm   Pertinent Labs/Studies:   LabResultsLast24Hours       Results for orders placed or performed during the hospital encounter of 10/07/17 (from the past 24 hour(s))  CBG monitoring, ED     Status: Abnormal   Collection Time: 10/07/17  2:00 PM  Result Value Ref Range   Glucose-Capillary 336 (H) 70 - 99 mg/dL  I-Stat beta hCG blood, ED     Status: Abnormal   Collection Time: 10/07/17  2:19 PM  Result Value Ref Range   I-stat hCG, quantitative >2,000.0 (H) <5 mIU/mL   Comment 3          Basic metabolic panel     Status: Abnormal   Collection Time: 10/07/17  2:50 PM  Result Value Ref Range   Sodium 134 (L) 135 - 145 mmol/L   Potassium 4.0 3.5 - 5.1 mmol/L   Chloride 103 98 - 111 mmol/L   CO2 20 (L) 22 - 32 mmol/L   Glucose, Bld 354 (H) 70 - 99 mg/dL   BUN 10 6 - 20 mg/dL   Creatinine, Ser 0.59 0.44 - 1.00 mg/dL   Calcium 8.6 (L) 8.9 - 10.3 mg/dL   GFR calc non Af Amer >60 >60 mL/min   GFR calc Af Amer >60 >60 mL/min   Anion gap 11 5 - 15  CBC     Status: Abnormal   Collection Time: 10/07/17  2:50 PM  Result Value Ref Range   WBC 9.1 4.0 - 10.5 K/uL   RBC 4.04 3.87 - 5.11 MIL/uL   Hemoglobin 11.9 (L) 12.0 - 15.0 g/dL   HCT 35.9 (L) 36.0 - 46.0 %   MCV 88.9 78.0 - 100.0 fL    MCH 29.5 26.0 - 34.0 pg   MCHC 33.1 30.0 - 36.0 g/dL   RDW 12.7 11.5 - 15.5 %   Platelets 229 150 - 400 K/uL  Urinalysis, Routine w reflex microscopic     Status: Abnormal   Collection Time: 10/07/17  3:32 PM  Result Value Ref Range  Color, Urine YELLOW YELLOW   APPearance CLEAR CLEAR   Specific Gravity, Urine 1.030 1.005 - 1.030   pH 6.0 5.0 - 8.0   Glucose, UA >=500 (A) NEGATIVE mg/dL   Hgb urine dipstick NEGATIVE NEGATIVE   Bilirubin Urine NEGATIVE NEGATIVE   Ketones, ur 80 (A) NEGATIVE mg/dL   Protein, ur NEGATIVE NEGATIVE mg/dL   Nitrite NEGATIVE NEGATIVE   Leukocytes, UA NEGATIVE NEGATIVE   RBC / HPF 0-5 0 - 5 RBC/hpf   WBC, UA 0-5 0 - 5 WBC/hpf   Bacteria, UA RARE (A) NONE SEEN   Squamous Epithelial / LPF 0-5 0 - 5   Mucus PRESENT   POC CBG, ED     Status: Abnormal   Collection Time: 10/07/17  4:42 PM  Result Value Ref Range   Glucose-Capillary 283 (H) 70 - 99 mg/dL   Comment 1 Notify RN    Comment 2 Document in Chart       Assessment : [name] [name] is a 22 y.o. G1P0 at [redacted]w[redacted]d being admitted for management of blood glucose for T1DM with blood glucose up to 350s. With diarrhea for 4-5 days. H/o of poorly controlled T1DM with most recent A1C 10.1.   Plan:  T1DM - NS @ 125 mL/hr - will start home regimen and check CBGs - diabetic diet  FWB  - normal FHT   Reviewed with patient that she will need to establish care with OB/GYN in [place].   Feliz Beam, M.D. [facility]  10/07/2017, 7:07 PM   Hospital Course: Patient never on gtt and just put back on what she was supposed to be on at home. BS improved and UTI treated with macrobid. Patient seen by DM education. 7/23 efw 58%, 729gm, AC 79% with mild poly at 8.29. A1c was 8.3. Pt told to not just carb count but to also add on SSI to carb counting dose with meals. Fetal monitoring reassuring.   Discharge Exam:   Current Vital Signs 24h Vital Sign  Ranges  T 98.1 F (36.7 C) Temp  Avg: 98.6 F (37 C)  Min: 98.1 F (36.7 C)  Max: 99 F (37.2 C)  BP 112/74 BP  Min: 102/56  Max: 120/81  HR 93 Pulse  Avg: 88.8  Min: 84  Max: 93  RR 18 Resp  Avg: 16.7  Min: 16  Max: 18  SaO2 100 % Room Air SpO2  Avg: 99.3 %  Min: 98 %  Max: 100 %       24 Hour I/O Current Shift I/O  Time Ins Outs 07/23 0701 - 07/24 0700 In: 1967.5 [I.V.:967.5] Out: -  No intake/output data recorded.   General appearance: Well nourished, well developed female in no acute distress.  Cardiovascular: S1, S2 normal, no murmur, rub or gallop, regular rate and rhythm Respiratory:  Clear to auscultation bilateral. Normal respiratory effort Abdomen: gravid, nttp Neuro/Psych:  Normal mood and affect.  Skin:  Warm and dry.   Discharge Disposition:  Home  Patient Instructions:  Standard   Results Pending at Discharge:  UCx  Discharge Medications: Allergies as of 10/09/2017      Reactions   Banana Itching, Swelling   Throat swelling, difficulty breathing      Medication List      TAKE these medications   aspirin 81 MG chewable tablet Chew 1 tablet (81 mg total) by mouth daily.   insulin aspart 100 UNIT/ML injection Commonly known as:  novoLOG Inject 0-20 Units into  the skin 3 (three) times daily before meals. Pt takes an amount thay is necessary ,depending on her sugar level and carb counting What changed:    how much to take  additional instructions   nitrofurantoin (macrocrystal-monohydrate) 100 MG capsule Commonly known as:  MACROBID Take 1 capsule (100 mg total) by mouth 2 (two) times daily for 7 days. Take the second dose right before you go to bed at night.   prenatal multivitamin Tabs tablet Take 1 tablet by mouth daily at 12 noon.   insulin glargine 100 UNIT/ML injection Commonly known as:  LANTUS 28 Units qhs     No future appointments.  Patient states she lives in [place] now. Pt told to keep seeing endocrine MD in [place].  Request sent to clinic to have her be seen for a visit next week.   Durene Romans MD Attending [facility])

## 2017-10-09 NOTE — Progress Notes (Signed)
I checked in with [name] [name] and [name] [name] before discharge today after meeting them yesterday.  They are in good spirits and looking forward to being home.    [employer]  Pager, [phone] 11:29 AM   10/09/17 1100  Clinical Encounter Type  Visited With Patient and family together  Visit Type Follow-up

## 2017-10-09 NOTE — Progress Notes (Signed)
Inpatient Diabetes Program Recommendations  AACE/ADA: New Consensus Statement on Inpatient Glycemic Control (2015)  Target Ranges:  Prepandial:   less than 140 mg/dL      Peak postprandial:   less than 180 mg/dL (1-2 hours)      Critically ill patients:  140 - 180 mg/dL   Lab Results  Component Value Date   GLUCAP 231 (H) 10/09/2017   HGBA1C 8.3 (H) 10/08/2017    Review of Glycemic Control Results for [name], [name] (MRN [id number]) as of 10/09/2017 10:42  Ref. Range 10/08/2017 23:57 10/09/2017 04:01 10/09/2017 08:18 10/09/2017 10:25  Glucose-Capillary Latest Ref Range: 70 - 99 mg/dL 221 (H) 120 (H) 113 (H) 231 (H)   Diabetes history: Type 1 DM Outpatient Diabetes medications: Lantus 28 units QHS, Novolog per SSI TID, Novolog per 1:8 CHO ratio TID Current orders for Inpatient glycemic control: Lantus 28 units QHS, Novolog 0-16 units TID, Novolog 0-11 custom scale TID PRN (for 2 H PP under glycemic control order set  Inpatient Diabetes Program Recommendations:    Noted addition of Glycemic control order set for Novolog 0-11 units to be used as correction following meals, based on a carb ratio of 1:8 CHO.  Post prandials remain >140 mg/dL, assuming that carb count was either incorrect or ratio needs to be increased.   Spoke with Gerald Stabs, RN regarding patient's abilty to carb count. RN states, "Patient knows how to carb count, but doesn't follow through with what she needs to do based on intake." RN reports patient eating burgers and fries last night that may not have all been covered by carb counting.   In preparation for discharge, patient to follow up with endocrinologist. Recommending increasing carb ratio to 1 units per 6 grams of CHO.   Thanks, Bronson Curb, MSN, RNC-OB Diabetes Coordinator [phone] (8a-5p)

## 2017-10-09 NOTE — Progress Notes (Signed)
Pt out with friend teaching complete

## 2017-10-10 ENCOUNTER — Encounter: Payer: Self-pay | Admitting: Obstetrics and Gynecology

## 2017-10-10 ENCOUNTER — Other Ambulatory Visit: Payer: Self-pay | Admitting: Obstetrics and Gynecology

## 2017-10-10 LAB — CULTURE, OB URINE

## 2017-10-14 ENCOUNTER — Encounter: Payer: Self-pay | Admitting: General Practice

## 2017-10-14 ENCOUNTER — Telehealth: Payer: Self-pay | Admitting: General Practice

## 2017-10-14 NOTE — Telephone Encounter (Signed)
Unable to reach patient via phone.  Mailed letter in regards to New OB and Diabetic Educator appointment.

## 2017-10-24 ENCOUNTER — Encounter: Payer: Medicaid Other | Attending: Obstetrics & Gynecology | Admitting: *Deleted

## 2017-10-24 ENCOUNTER — Ambulatory Visit: Payer: Medicaid Other | Admitting: *Deleted

## 2017-10-24 DIAGNOSIS — Z3A00 Weeks of gestation of pregnancy not specified: Secondary | ICD-10-CM | POA: Insufficient documentation

## 2017-10-24 DIAGNOSIS — O24012 Pre-existing diabetes mellitus, type 1, in pregnancy, second trimester: Secondary | ICD-10-CM | POA: Insufficient documentation

## 2017-10-24 DIAGNOSIS — Z713 Dietary counseling and surveillance: Secondary | ICD-10-CM | POA: Diagnosis not present

## 2017-10-24 DIAGNOSIS — E1065 Type 1 diabetes mellitus with hyperglycemia: Secondary | ICD-10-CM

## 2017-10-24 NOTE — Progress Notes (Signed)
Patient was seen on 10/24/2017 for type 1 Diabetes and pregnancy self-management. EDD 01/25/2018.  Diet history obtained. Patient eats good variety of all food groups and beverages include only water.  Her comfort level with carb counting is 8/10. Patient is currently on Lantus and Novolog diabetes medications. She has been on Omnipod insulin pump in the past but could not afford after age 22 when she came off of Medicaid. She states when she tests more than 4 times a day, she runs out of strips, so she tests 3 times a day to make the test strips last all month. She states she is having hypoglycemia less often now, but currently it occurs 2-3 times a week. She states she still has symptoms for hypoglycemia and treats with juice or other sweetened beverage, about 8 oz. She would like to know what types of foods she can eat when hungry that don't affect BG as much as carbs do.   Current Insulin dose per patient: Lantus @ 28 units at night Novolog before each meal @ Carb Ratio of 1 unit / 8 grams carbohydrate plus correction dose of 1 unit / every 40 points above target of 140 mg/dl. The following learning objectives were met by the patient :   States the definition of  type 1 Diabetes and pregnancy   States why dietary management is important in controlling blood glucose  Describes the effects of carbohydrates on blood glucose levels  Demonstrates ability to create a balanced meal plan  Demonstrates carbohydrate counting both in grams and now by Food Group  States when to check blood glucose levels  Demonstrates proper blood glucose monitoring techniques  States the effect of stress and exercise on blood glucose levels  States the importance of limiting caffeine and abstaining from alcohol and smoking  Plan:   Aim for 3 Carb Choices per meal (45 grams) +/- 1 either way   Aim for 1-2 Carbs per snack  Begin reading food labels for Total Carbohydrate of foods  Consider  increasing your  activity level by walking or other activity daily as tolerated  Begin checking BG before breakfast and 2 hours after first bite of breakfast, lunch and dinner as directed by MD   Bring Log Book/Sheet to every medical appointment    Patient is followed by endocrinologist at [facility], so she was not introduced to [employer] today  Take medication as directed by MD  We discussed the option of taking 2 units for every Carb Choice (15 grams) as back up plan when gram information is not available.   Patient already has a meter:  And is testing pre-meal to be able to determine correction insulin dose and occasionally 2 hours each meal as directed by MD  Patient instructed to monitor glucose levels: FBS: 60 - 95 mg/dl 2 hour: <120 mg/dl  Patient received the following handouts:  Nutrition Diabetes and Pregnancy  Carbohydrate Counting List  Patient will be seen for follow-up in 1 month and as needed.

## 2017-10-24 NOTE — BH Specialist Note (Deleted)
Integrated Behavioral Health Initial Visit  MRN: [id number] Name: [name] [name]  Number of [place] Clinician visits:: 1/6 Session Start time: ***  Session End time: *** Total time: {IBH Total Time:21014050}  Type of Service: [place] Interpretor:No. Interpretor Name and Language: n/a   Warm Hand Off Completed.       SUBJECTIVE: [name] [name] is a 22 y.o. female accompanied by {CHL AMB ACCOMPANIED MS:1115520802} Patient was referred by Vivien Rota, MD for initial OB introduction to integrated behavioral health services. Patient reports the following symptoms/concerns: *** Duration of problem: ***; Severity of problem: {Mild/Moderate/Severe:20260}  OBJECTIVE: Mood: {BHH MOOD:22306} and Affect: {BHH AFFECT:22307} Risk of harm to self or others: {CHL AMB BH Suicide Current Mental Status:21022748}  LIFE CONTEXT: Family and Social: *** School/Work: *** Self-Care: *** Life Changes: Current pregnancy ***  GOALS ADDRESSED: Patient will: 1. Reduce symptoms of: {IBH Symptoms:21014056} 2. Increase knowledge and/or ability of: {IBH Patient Tools:21014057}  3. Demonstrate ability to: {IBH Goals:21014053}  INTERVENTIONS: Interventions utilized: {IBH Interventions:21014054}  Standardized Assessments completed: GAD-7 and PHQ 9  ASSESSMENT: Patient currently experiencing Supervision of high risk pregnancy, antepartum ***   Patient may benefit from initial OB introduction of integrated behavioral health services.  PLAN: 1. Follow up with behavioral health clinician on : *** 2. Behavioral recommendations: *** 3. Referral(s): {IBH Referrals:21014055} 4. "From scale of 1-10, how likely are you to follow plan?": ***  Jamie C McMannes, LCSW  No flowsheet data found.  No flowsheet data found.

## 2017-10-25 ENCOUNTER — Institutional Professional Consult (permissible substitution): Payer: Self-pay

## 2017-10-30 DIAGNOSIS — Z8709 Personal history of other diseases of the respiratory system: Secondary | ICD-10-CM | POA: Insufficient documentation

## 2017-10-31 ENCOUNTER — Encounter: Payer: Self-pay | Admitting: Family Medicine

## 2017-10-31 ENCOUNTER — Ambulatory Visit (INDEPENDENT_AMBULATORY_CARE_PROVIDER_SITE_OTHER): Payer: Medicaid Other | Admitting: Family Medicine

## 2017-10-31 ENCOUNTER — Ambulatory Visit: Payer: Medicaid Other | Admitting: Clinical

## 2017-10-31 VITALS — BP 119/72 | HR 94 | Wt 187.0 lb

## 2017-10-31 DIAGNOSIS — O402XX0 Polyhydramnios, second trimester, not applicable or unspecified: Secondary | ICD-10-CM

## 2017-10-31 DIAGNOSIS — O0992 Supervision of high risk pregnancy, unspecified, second trimester: Secondary | ICD-10-CM | POA: Diagnosis not present

## 2017-10-31 DIAGNOSIS — O99212 Obesity complicating pregnancy, second trimester: Secondary | ICD-10-CM

## 2017-10-31 DIAGNOSIS — O24013 Pre-existing diabetes mellitus, type 1, in pregnancy, third trimester: Secondary | ICD-10-CM

## 2017-10-31 DIAGNOSIS — O360930 Maternal care for other rhesus isoimmunization, third trimester, not applicable or unspecified: Secondary | ICD-10-CM | POA: Diagnosis not present

## 2017-10-31 DIAGNOSIS — O24919 Unspecified diabetes mellitus in pregnancy, unspecified trimester: Secondary | ICD-10-CM | POA: Insufficient documentation

## 2017-10-31 DIAGNOSIS — O24012 Pre-existing diabetes mellitus, type 1, in pregnancy, second trimester: Secondary | ICD-10-CM | POA: Diagnosis not present

## 2017-10-31 DIAGNOSIS — O99210 Obesity complicating pregnancy, unspecified trimester: Secondary | ICD-10-CM

## 2017-10-31 DIAGNOSIS — Z6791 Unspecified blood type, Rh negative: Secondary | ICD-10-CM

## 2017-10-31 DIAGNOSIS — O0990 Supervision of high risk pregnancy, unspecified, unspecified trimester: Secondary | ICD-10-CM

## 2017-10-31 DIAGNOSIS — Z23 Encounter for immunization: Secondary | ICD-10-CM | POA: Diagnosis not present

## 2017-10-31 DIAGNOSIS — O409XX0 Polyhydramnios, unspecified trimester, not applicable or unspecified: Secondary | ICD-10-CM

## 2017-10-31 DIAGNOSIS — O26899 Other specified pregnancy related conditions, unspecified trimester: Secondary | ICD-10-CM

## 2017-10-31 LAB — POCT URINALYSIS DIP (DEVICE)
BILIRUBIN URINE: NEGATIVE
Glucose, UA: 500 mg/dL — AB
HGB URINE DIPSTICK: NEGATIVE
Ketones, ur: 15 mg/dL — AB
LEUKOCYTES UA: NEGATIVE
Nitrite: NEGATIVE
PH: 5.5 (ref 5.0–8.0)
Protein, ur: NEGATIVE mg/dL
Urobilinogen, UA: 0.2 mg/dL (ref 0.0–1.0)

## 2017-10-31 MED ORDER — ACCU-CHEK FASTCLIX LANCETS MISC: 1.0000 [IU] | Freq: Four times a day (QID) | 12 refills | Status: DC

## 2017-10-31 MED ORDER — INSULIN ASPART 100 UNIT/ML ~~LOC~~ SOLN: 0.0000 [IU] | Freq: Three times a day (TID) | SUBCUTANEOUS | 11 refills | Status: DC

## 2017-10-31 MED ORDER — ACCU-CHEK NANO SMARTVIEW W/DEVICE KIT: 1.0000 | PACK | 0 refills | Status: DC

## 2017-10-31 MED ORDER — INSULIN GLARGINE 100 UNIT/ML ~~LOC~~ SOLN: 20.0000 [IU] | Freq: Two times a day (BID) | SUBCUTANEOUS | 11 refills | Status: DC

## 2017-10-31 MED ORDER — RHO D IMMUNE GLOBULIN 1500 UNIT/2ML IJ SOSY
300.0000 ug | PREFILLED_SYRINGE | Freq: Once | INTRAMUSCULAR | Status: AC
  Administered 2017-10-31: 300 ug via INTRAMUSCULAR

## 2017-10-31 MED ORDER — GLUCOSE BLOOD VI STRP: ORAL_STRIP | 12 refills | Status: DC

## 2017-10-31 NOTE — Progress Notes (Signed)
Subjective:  [name] [name] is a 22 y.o. G1P0000 at [redacted]w[redacted]d being seen today for initial prenatal care.  Prenatal care was previously at [facility] [place]. She is currently monitored for the following issues for this high-risk pregnancy and has DM (diabetes mellitus), type 1 ([place]); Supervision of high risk pregnancy, antepartum; Rh negative state in antepartum period; Migraines; Severe obesity (BMI 35.0-39.9) with comorbidity ([place]); Obesity in pregnancy; UTI in pregnancy; DKA, type 1 ([place]); Polyhydramnios affecting pregnancy; Diabetes mellitus in pregnancy; and Rh negative status during pregnancy in second trimester on their problem list.  GDM: Patient taking  Lantus:  28 units at bedtime.   Aspart:  1:8 carb counting   1:40 correction  Reports 2-3 hypoglycemic episodes per week.  Tolerating medication well All blood sugars are quite elevated, mostly in the 200-300 range.  Patient reports no complaints.  Contractions: Irritability. Vag. Bleeding: None.  Movement: Present. Denies leaking of fluid.   The following portions of the patient's history were reviewed and updated as appropriate: allergies, current medications, past family history, past medical history, past social history, past surgical history and problem list. Problem list updated.  Objective:   Vitals:   10/31/17 1350  BP: 119/72  Pulse: 94  Weight: 187 lb (84.8 kg)    Fetal Status: Fetal Heart Rate (bpm): 152   Movement: Present     General:  Alert, oriented and cooperative. Patient is in no acute distress.  Skin: Skin is warm and dry. No rash noted.   Cardiovascular: Normal heart rate noted  Respiratory: Normal respiratory effort, no problems with respiration noted  Abdomen: Soft, gravid, appropriate for gestational age. Pain/Pressure: Present     Pelvic: Vag. Bleeding: None     Cervical exam deferred        Extremities: Normal range of motion.  Edema: Trace  Mental Status: Normal mood and affect. Normal behavior. Normal judgment and  thought content.   Urinalysis:      Assessment and Plan:  Pregnancy: G1P0000 at [redacted]w[redacted]d  1. Pre-existing type 1 diabetes mellitus during pregnancy in third trimester Start lantus in AM - 20 units in AM and 20 units in PM Continue correction and carb counting F/u US next week Return in 1 week for recheck BPP weekly starting 32 weeks  2. Polyhydramnios affecting pregnancy Recheck next week  3. Obesity in pregnancy   4. Supervision of high risk pregnancy, antepartum   5. Rh negative state in antepartum period Rhogam today  Preterm labor symptoms and general obstetric precautions including but not limited to vaginal bleeding, contractions, leaking of fluid and fetal movement were reviewed in detail with the patient. Please refer to After Visit Summary for other counseling recommendations.  No follow-ups on file.   Truett Mainland, DO

## 2017-10-31 NOTE — BH Specialist Note (Deleted)
Error

## 2017-10-31 NOTE — Patient Instructions (Addendum)

## 2017-11-04 ENCOUNTER — Encounter: Payer: Self-pay | Admitting: General Practice

## 2017-11-04 NOTE — Progress Notes (Unsigned)
Patient triggered in [employer] for elevated blood sugars. Per chart review, patient saw Dr Nehemiah Settle 8/15 for new OB visit. Will route to him.

## 2017-11-05 NOTE — Progress Notes (Unsigned)
Rcvd Alert from baby scripts reg pt's blood sugar level:

## 2017-11-06 ENCOUNTER — Encounter: Payer: Self-pay | Admitting: Obstetrics and Gynecology

## 2017-11-06 ENCOUNTER — Ambulatory Visit (INDEPENDENT_AMBULATORY_CARE_PROVIDER_SITE_OTHER): Payer: Medicaid Other | Admitting: Obstetrics and Gynecology

## 2017-11-06 ENCOUNTER — Ambulatory Visit: Payer: Self-pay

## 2017-11-06 VITALS — BP 111/69 | HR 98 | Wt 189.0 lb

## 2017-11-06 DIAGNOSIS — E109 Type 1 diabetes mellitus without complications: Secondary | ICD-10-CM

## 2017-11-06 DIAGNOSIS — Z6791 Unspecified blood type, Rh negative: Secondary | ICD-10-CM

## 2017-11-06 DIAGNOSIS — O403XX0 Polyhydramnios, third trimester, not applicable or unspecified: Secondary | ICD-10-CM | POA: Diagnosis not present

## 2017-11-06 DIAGNOSIS — O0990 Supervision of high risk pregnancy, unspecified, unspecified trimester: Secondary | ICD-10-CM

## 2017-11-06 DIAGNOSIS — O409XX0 Polyhydramnios, unspecified trimester, not applicable or unspecified: Secondary | ICD-10-CM

## 2017-11-06 DIAGNOSIS — O368130 Decreased fetal movements, third trimester, not applicable or unspecified: Secondary | ICD-10-CM | POA: Diagnosis not present

## 2017-11-06 DIAGNOSIS — O0993 Supervision of high risk pregnancy, unspecified, third trimester: Secondary | ICD-10-CM | POA: Diagnosis not present

## 2017-11-06 DIAGNOSIS — O26893 Other specified pregnancy related conditions, third trimester: Secondary | ICD-10-CM

## 2017-11-06 DIAGNOSIS — O2343 Unspecified infection of urinary tract in pregnancy, third trimester: Secondary | ICD-10-CM | POA: Diagnosis not present

## 2017-11-06 DIAGNOSIS — O26899 Other specified pregnancy related conditions, unspecified trimester: Secondary | ICD-10-CM

## 2017-11-06 LAB — POCT URINALYSIS DIP (DEVICE)
Bilirubin Urine: NEGATIVE
Glucose, UA: 100 mg/dL — AB
HGB URINE DIPSTICK: NEGATIVE
KETONES UR: NEGATIVE mg/dL
Leukocytes, UA: NEGATIVE
Nitrite: NEGATIVE
PH: 6.5 (ref 5.0–8.0)
PROTEIN: NEGATIVE mg/dL
Specific Gravity, Urine: 1.015 (ref 1.005–1.030)
UROBILINOGEN UA: 0.2 mg/dL (ref 0.0–1.0)

## 2017-11-06 NOTE — Progress Notes (Signed)
Patient seen in office today for visit.

## 2017-11-06 NOTE — Progress Notes (Signed)
Patient reports numbness around navel; reports decreased FM for past 3 days, feels baby move in the morning & at night but not really during the day.

## 2017-11-06 NOTE — Progress Notes (Signed)

## 2017-11-06 NOTE — Progress Notes (Signed)
   PRENATAL VISIT NOTE  Subjective:  [name] [name] is a 22 y.o. G1P0000 at [redacted]w[redacted]d being seen today for ongoing prenatal care.  She is currently monitored for the following issues for this high-risk pregnancy and has DM (diabetes mellitus), type 1 ([place]); Supervision of high risk pregnancy, antepartum; Rh negative state in antepartum period; Migraines; Severe obesity (BMI 35.0-39.9) with comorbidity ([place]); Obesity in pregnancy; UTI in pregnancy; DKA, type 1 ([place]); Polyhydramnios affecting pregnancy; Diabetes mellitus in pregnancy; and Rh negative status during pregnancy in second trimester on their problem list.  Patient reports occasional abdominal pain.  Contractions: Irritability. Vag. Bleeding: None.  Movement: (!) Decreased. Denies leaking of fluid. Pain comes and goes on left upper abdomen, feels like aching, also with some numbness around belly button. Decreased movement the past three days.  The following portions of the patient's history were reviewed and updated as appropriate: allergies, current medications, past family history, past medical history, past social history, past surgical history and problem list. Problem list updated.  Objective:   Vitals:   11/06/17 0942  BP: 111/69  Pulse: 98  Weight: 189 lb (85.7 kg)    Fetal Status: Fetal Heart Rate (bpm): 155   Movement: (!) Decreased  Presentation: Vertex  General:  Alert, oriented and cooperative. Patient is in no acute distress.  Skin: Skin is warm and dry. No rash noted.   Cardiovascular: Normal heart rate noted  Respiratory: Normal respiratory effort, no problems with respiration noted  Abdomen: Soft, gravid, appropriate for gestational age.  Pain/Pressure: Present     Pelvic: Cervical exam deferred        Extremities: Normal range of motion.  Edema: None  Mental Status: Normal mood and affect. Normal behavior. Normal judgment and thought content.   Assessment and Plan:  Pregnancy: G1P0000 at [redacted]w[redacted]d  1. Supervision of  high risk pregnancy, antepartum  2. Rh negative state in antepartum period Rho gam 10/31/17  3. Polyhydramnios affecting pregnancy Needs f/u US to recheck, scheduled today  4. Type 1 diabetes mellitus without complication (HCC) Patient started last week: lantus in AM - 20 units in AM and 20 units in PM Aspart:             1:8 carb counting                         1:40 correction Continue correction and carb counting FG: 186-258 PP: 173-350 Will increase lantus AM and PM to 24 units Patient has not been scheduled for antenatal testing, will add on for BPP/NST today BPP 8/10, -2 for breathing Start weekly testing  5. Urinary tract infection in mother during third trimester of pregnancy TOC next visit  Preterm labor symptoms and general obstetric precautions including but not limited to vaginal bleeding, contractions, leaking of fluid and fetal movement were reviewed in detail with the patient. Please refer to After Visit Summary for other counseling recommendations.  Return in about 1 week (around 11/13/2017) for OB visit (MD) & NST.  Future Appointments  Date Time Provider [place]  11/11/2017  9:00 AM WH-MFC [place] 3 WH-MFCUS MFC-US  11/14/2017  1:15 PM WOC-WOCA NST WOC-WOCA WOC  11/14/2017  2:15 PM Chancy Milroy, MD WOC-WOCA WOC  11/21/2017  8:00 AM WOC-EDUCATION WOC-WOCA WOC    Sloan Leiter, MD

## 2017-11-08 NOTE — Progress Notes (Signed)
error 

## 2017-11-11 ENCOUNTER — Ambulatory Visit (HOSPITAL_COMMUNITY): Admission: RE | Admit: 2017-11-11 | Payer: Medicaid Other | Source: Ambulatory Visit

## 2017-11-13 ENCOUNTER — Ambulatory Visit (HOSPITAL_COMMUNITY): Admission: RE | Admit: 2017-11-13 | Payer: Medicaid Other | Source: Ambulatory Visit

## 2017-11-14 ENCOUNTER — Encounter: Payer: Self-pay | Admitting: Obstetrics and Gynecology

## 2017-11-14 ENCOUNTER — Ambulatory Visit: Payer: Self-pay

## 2017-11-14 ENCOUNTER — Ambulatory Visit (INDEPENDENT_AMBULATORY_CARE_PROVIDER_SITE_OTHER): Payer: Medicaid Other | Admitting: Obstetrics and Gynecology

## 2017-11-14 ENCOUNTER — Ambulatory Visit (INDEPENDENT_AMBULATORY_CARE_PROVIDER_SITE_OTHER): Payer: Medicaid Other | Admitting: *Deleted

## 2017-11-14 VITALS — BP 127/72 | HR 101 | Wt 189.5 lb

## 2017-11-14 DIAGNOSIS — O409XX0 Polyhydramnios, unspecified trimester, not applicable or unspecified: Secondary | ICD-10-CM | POA: Diagnosis present

## 2017-11-14 DIAGNOSIS — O24013 Pre-existing diabetes mellitus, type 1, in pregnancy, third trimester: Secondary | ICD-10-CM

## 2017-11-14 DIAGNOSIS — O0990 Supervision of high risk pregnancy, unspecified, unspecified trimester: Secondary | ICD-10-CM

## 2017-11-14 DIAGNOSIS — O26899 Other specified pregnancy related conditions, unspecified trimester: Secondary | ICD-10-CM

## 2017-11-14 DIAGNOSIS — Z6791 Unspecified blood type, Rh negative: Secondary | ICD-10-CM

## 2017-11-14 NOTE — Progress Notes (Signed)
Subjective:  [name] [name] is a 22 y.o. G1P0000 at 32w5dbeing seen today for ongoing prenatal care.  She is currently monitored for the following issues for this high-risk pregnancy and has DM (diabetes mellitus), type 1 ([place]; Supervision of high risk pregnancy, antepartum; Rh negative state in antepartum period; Migraines; Severe obesity (BMI 35.0-39.9) with comorbidity ([place]; Obesity in pregnancy; UTI in pregnancy; DKA, type 1 ([place]; Polyhydramnios affecting pregnancy; Diabetes mellitus in pregnancy; and Rh negative status during pregnancy in second trimester on their problem list.  Patient reports no complaints.   . Vag. Bleeding: None.  Movement: Present. Denies leaking of fluid.   The following portions of the patient's history were reviewed and updated as appropriate: allergies, current medications, past family history, past medical history, past social history, past surgical history and problem list. Problem list updated.  Objective:   Vitals:   11/14/17 1405  BP: 127/72  Pulse: (!) 101  Weight: 189 lb 8 oz (86 kg)    Fetal Status: Fetal Heart Rate (bpm): NST   Movement: Present     General:  Alert, oriented and cooperative. Patient is in no acute distress.  Skin: Skin is warm and dry. No rash noted.   Cardiovascular: Normal heart rate noted  Respiratory: Normal respiratory effort, no problems with respiration noted  Abdomen: Soft, gravid, appropriate for gestational age. Pain/Pressure: Present     Pelvic:  Cervical exam deferred        Extremities: Normal range of motion.  Edema: Trace  Mental Status: Normal mood and affect. Normal behavior. Normal judgment and thought content.   Urinalysis:      Assessment and Plan:  Pregnancy: G1P0000 at 265w5d1. Supervision of high risk pregnancy, antepartum Stable  2. Polyhydramnios affecting pregnancy BPP tomorrow  3. Pre-existing type 1 diabetes mellitus during pregnancy in third trimester [facility] Pt to continue follow up and management with [facility] endocrine until change completed. CBG's not in goal range but much improved with recent changes in therapy as per endocrine NSt reactive Continue with weekly antenatal testing Fetal ECHO normal in July Reports seeing personal Opth in June/July, will sign for release of medical records - CBC - Comp Met (CMET) - Protein / creatinine ratio, urine - RPR - Culture, OB Urine  4. Rh negative state in antepartum period S/P Rhogam  Preterm labor symptoms and general obstetric precautions including but not limited to vaginal bleeding, contractions, leaking of fluid and fetal movement were reviewed in detail with the patient. Please refer to After Visit Summary for other counseling recommendations.  Return in about 1 week (around 11/21/2017) for change Diabetes appt to 0900, needs NST @ 1015 - double book ok.   ErChancy MilroyMD

## 2017-11-14 NOTE — Progress Notes (Signed)

## 2017-11-14 NOTE — Progress Notes (Signed)
Pt DNKA yesterday for [place] growth/BPP - rescheduled to 9/5 due to transportation problem.  Pt states her insulin dose regimen was changed by her endocrinologist in [place]. Her new PCP in [place] has referred her to a different endocrinologist Youth worker @ [place]) in [place] due to transportation difficulties. She has appt @ [facility] on 9/18.

## 2017-11-15 LAB — COMPREHENSIVE METABOLIC PANEL
A/G RATIO: 1.1 — AB (ref 1.2–2.2)
ALT: 10 IU/L (ref 0–32)
AST: 9 IU/L (ref 0–40)
Albumin: 3.4 g/dL — ABNORMAL LOW (ref 3.5–5.5)
Alkaline Phosphatase: 67 IU/L (ref 39–117)
BUN/Creatinine Ratio: 13 (ref 9–23)
BUN: 7 mg/dL (ref 6–20)
Bilirubin Total: <0.2 mg/dL (ref 0.0–1.2)
CALCIUM: 9.3 mg/dL (ref 8.7–10.2)
CO2: 21 mmol/L (ref 20–29)
CREATININE: 0.53 mg/dL — AB (ref 0.57–1.00)
Chloride: 104 mmol/L (ref 96–106)
GFR, EST AFRICAN AMERICAN: 156 mL/min/{1.73_m2} (ref >59)
GFR, EST NON AFRICAN AMERICAN: 135 mL/min/{1.73_m2} (ref >59)
GLOBULIN, TOTAL: 3 g/dL (ref 1.5–4.5)
Glucose: 103 mg/dL — ABNORMAL HIGH (ref 65–99)
POTASSIUM: 4.1 mmol/L (ref 3.5–5.2)
SODIUM: 138 mmol/L (ref 134–144)
TOTAL PROTEIN: 6.4 g/dL (ref 6.0–8.5)

## 2017-11-15 LAB — CBC
HEMATOCRIT: 34.6 % (ref 34.0–46.6)
Hemoglobin: 12 g/dL (ref 11.1–15.9)
MCH: 29.7 pg (ref 26.6–33.0)
MCHC: 34.7 g/dL (ref 31.5–35.7)
MCV: 86 fL (ref 79–97)
Platelets: 236 10*3/uL (ref 150–450)
RBC: 4.04 x10E6/uL (ref 3.77–5.28)
RDW: 13.8 % (ref 12.3–15.4)
WBC: 6.7 10*3/uL (ref 3.4–10.8)

## 2017-11-15 LAB — RPR: RPR Ser Ql: NONREACTIVE

## 2017-11-15 LAB — PROTEIN / CREATININE RATIO, URINE
Creatinine, Urine: 53.3 mg/dL
PROTEIN/CREAT RATIO: 698 mg/g{creat} — AB (ref 0–200)
Protein, Ur: 37.2 mg/dL

## 2017-11-18 LAB — URINE CULTURE, OB REFLEX

## 2017-11-18 LAB — CULTURE, OB URINE

## 2017-11-19 ENCOUNTER — Telehealth: Payer: Self-pay

## 2017-11-19 ENCOUNTER — Other Ambulatory Visit: Payer: Self-pay

## 2017-11-19 ENCOUNTER — Encounter (HOSPITAL_COMMUNITY): Payer: Self-pay | Admitting: *Deleted

## 2017-11-19 ENCOUNTER — Inpatient Hospital Stay (HOSPITAL_COMMUNITY)
Admission: AD | Admit: 2017-11-19 | Discharge: 2017-11-22 | DRG: 832 | Disposition: A | Discharge status: Home / Self Care | Payer: Medicaid Other | Attending: Obstetrics and Gynecology | Admitting: Obstetrics and Gynecology

## 2017-11-19 DIAGNOSIS — O24013 Pre-existing diabetes mellitus, type 1, in pregnancy, third trimester: Secondary | ICD-10-CM | POA: Diagnosis present

## 2017-11-19 DIAGNOSIS — O2303 Infections of kidney in pregnancy, third trimester: Secondary | ICD-10-CM

## 2017-11-19 DIAGNOSIS — Z6791 Unspecified blood type, Rh negative: Secondary | ICD-10-CM | POA: Diagnosis not present

## 2017-11-19 DIAGNOSIS — R109 Unspecified abdominal pain: Secondary | ICD-10-CM | POA: Diagnosis present

## 2017-11-19 DIAGNOSIS — Z794 Long term (current) use of insulin: Secondary | ICD-10-CM | POA: Diagnosis not present

## 2017-11-19 DIAGNOSIS — O0990 Supervision of high risk pregnancy, unspecified, unspecified trimester: Secondary | ICD-10-CM

## 2017-11-19 DIAGNOSIS — O99212 Obesity complicating pregnancy, second trimester: Secondary | ICD-10-CM | POA: Diagnosis not present

## 2017-11-19 DIAGNOSIS — O26893 Other specified pregnancy related conditions, third trimester: Secondary | ICD-10-CM | POA: Diagnosis present

## 2017-11-19 DIAGNOSIS — Z3490 Encounter for supervision of normal pregnancy, unspecified, unspecified trimester: Secondary | ICD-10-CM

## 2017-11-19 DIAGNOSIS — Z3A30 30 weeks gestation of pregnancy: Secondary | ICD-10-CM | POA: Diagnosis not present

## 2017-11-19 DIAGNOSIS — O409XX0 Polyhydramnios, unspecified trimester, not applicable or unspecified: Secondary | ICD-10-CM | POA: Diagnosis present

## 2017-11-19 DIAGNOSIS — O403XX0 Polyhydramnios, third trimester, not applicable or unspecified: Secondary | ICD-10-CM | POA: Diagnosis present

## 2017-11-19 DIAGNOSIS — O99213 Obesity complicating pregnancy, third trimester: Secondary | ICD-10-CM | POA: Diagnosis present

## 2017-11-19 DIAGNOSIS — O26899 Other specified pregnancy related conditions, unspecified trimester: Secondary | ICD-10-CM

## 2017-11-19 DIAGNOSIS — O368130 Decreased fetal movements, third trimester, not applicable or unspecified: Secondary | ICD-10-CM | POA: Diagnosis not present

## 2017-11-19 DIAGNOSIS — E1065 Type 1 diabetes mellitus with hyperglycemia: Secondary | ICD-10-CM

## 2017-11-19 DIAGNOSIS — IMO0001 Reserved for inherently not codable concepts without codable children: Secondary | ICD-10-CM | POA: Diagnosis present

## 2017-11-19 DIAGNOSIS — E109 Type 1 diabetes mellitus without complications: Secondary | ICD-10-CM | POA: Diagnosis present

## 2017-11-19 HISTORY — DX: Infections of kidney in pregnancy, third trimester: O23.03

## 2017-11-19 LAB — COMPREHENSIVE METABOLIC PANEL
ALK PHOS: 71 U/L (ref 38–126)
ALT: 12 U/L (ref 0–44)
ANION GAP: 12 (ref 5–15)
AST: 14 U/L — ABNORMAL LOW (ref 15–41)
Albumin: 2.5 g/dL — ABNORMAL LOW (ref 3.5–5.0)
BUN: <5 mg/dL — ABNORMAL LOW (ref 6–20)
CALCIUM: 7.8 mg/dL — AB (ref 8.9–10.3)
CHLORIDE: 105 mmol/L (ref 98–111)
CO2: 16 mmol/L — AB (ref 22–32)
Creatinine, Ser: 0.53 mg/dL (ref 0.44–1.00)
Glucose, Bld: 218 mg/dL — ABNORMAL HIGH (ref 70–99)
Potassium: 3.4 mmol/L — ABNORMAL LOW (ref 3.5–5.1)
SODIUM: 133 mmol/L — AB (ref 135–145)
Total Bilirubin: 0.8 mg/dL (ref 0.3–1.2)
Total Protein: 6.3 g/dL — ABNORMAL LOW (ref 6.5–8.1)

## 2017-11-19 LAB — CBC WITH DIFFERENTIAL/PLATELET
Basophils Absolute: 0 10*3/uL (ref 0.0–0.1)
Basophils Relative: 0 %
EOS ABS: 0 10*3/uL (ref 0.0–0.7)
EOS PCT: 0 %
HCT: 32.4 % — ABNORMAL LOW (ref 36.0–46.0)
Hemoglobin: 10.9 g/dL — ABNORMAL LOW (ref 12.0–15.0)
LYMPHS ABS: 2.4 10*3/uL (ref 0.7–4.0)
LYMPHS PCT: 19 %
MCH: 29.4 pg (ref 26.0–34.0)
MCHC: 33.6 g/dL (ref 30.0–36.0)
MCV: 87.3 fL (ref 78.0–100.0)
MONOS PCT: 5 %
Monocytes Absolute: 0.6 10*3/uL (ref 0.1–1.0)
Neutro Abs: 9.5 10*3/uL — ABNORMAL HIGH (ref 1.7–7.7)
Neutrophils Relative %: 76 %
PLATELETS: 196 10*3/uL (ref 150–400)
RBC: 3.71 MIL/uL — ABNORMAL LOW (ref 3.87–5.11)
RDW: 13.2 % (ref 11.5–15.5)
WBC: 12.6 10*3/uL — ABNORMAL HIGH (ref 4.0–10.5)

## 2017-11-19 LAB — LACTIC ACID, PLASMA: LACTIC ACID, VENOUS: 1.2 mmol/L (ref 0.5–1.9)

## 2017-11-19 MED ORDER — SODIUM CHLORIDE 0.9 % IV BOLUS: 1000.0000 mL | Freq: Once | INTRAVENOUS | Status: DC

## 2017-11-19 MED ORDER — SODIUM CHLORIDE 0.9 % IV BOLUS
1000.0000 mL | Freq: Once | INTRAVENOUS | Status: AC
  Administered 2017-11-19: 1000 mL via INTRAVENOUS

## 2017-11-19 MED ORDER — LACTATED RINGERS IV SOLN: INTRAVENOUS | Status: DC

## 2017-11-19 MED ORDER — ENOXAPARIN SODIUM 40 MG/0.4ML ~~LOC~~ SOLN
40.0000 mg | SUBCUTANEOUS | Status: DC
  Administered 2017-11-19 – 2017-11-21 (×3): 40 mg via SUBCUTANEOUS
  Filled 2017-11-19 (×3): qty 0.4

## 2017-11-19 MED ORDER — INSULIN GLARGINE 100 UNIT/ML ~~LOC~~ SOLN
20.0000 [IU] | Freq: Two times a day (BID) | SUBCUTANEOUS | Status: DC
  Administered 2017-11-19 – 2017-11-22 (×6): 20 [IU] via SUBCUTANEOUS
  Filled 2017-11-19 (×8): qty 0.2

## 2017-11-19 MED ORDER — CALCIUM CARBONATE ANTACID 500 MG PO CHEW: 2.0000 | CHEWABLE_TABLET | ORAL | Status: DC | PRN

## 2017-11-19 MED ORDER — ACETAMINOPHEN 325 MG PO TABS
650.0000 mg | ORAL_TABLET | ORAL | Status: DC | PRN
  Administered 2017-11-20 (×2): 650 mg via ORAL
  Filled 2017-11-19 (×2): qty 2

## 2017-11-19 MED ORDER — INSULIN ASPART 100 UNIT/ML ~~LOC~~ SOLN: 0.0000 [IU] | Freq: Three times a day (TID) | SUBCUTANEOUS | Status: DC

## 2017-11-19 MED ORDER — SODIUM CHLORIDE 0.9 % IV SOLN
INTRAVENOUS | Status: DC
  Administered 2017-11-19 – 2017-11-21 (×5): via INTRAVENOUS

## 2017-11-19 MED ORDER — DOCUSATE SODIUM 100 MG PO CAPS
100.0000 mg | ORAL_CAPSULE | Freq: Every day | ORAL | Status: DC
  Administered 2017-11-20 – 2017-11-22 (×3): 100 mg via ORAL
  Filled 2017-11-19 (×3): qty 1

## 2017-11-19 MED ORDER — PRENATAL MULTIVITAMIN CH
1.0000 | ORAL_TABLET | Freq: Every day | ORAL | Status: DC
  Administered 2017-11-20 – 2017-11-21 (×2): 1 via ORAL
  Filled 2017-11-19 (×2): qty 1

## 2017-11-19 MED ORDER — ASPIRIN 81 MG PO CHEW
81.0000 mg | CHEWABLE_TABLET | Freq: Every day | ORAL | Status: DC
  Administered 2017-11-20 – 2017-11-22 (×3): 81 mg via ORAL
  Filled 2017-11-19 (×4): qty 1

## 2017-11-19 MED ORDER — ONDANSETRON HCL 4 MG/2ML IJ SOLN
4.0000 mg | Freq: Once | INTRAMUSCULAR | Status: AC
  Administered 2017-11-19: 4 mg via INTRAVENOUS
  Filled 2017-11-19: qty 2

## 2017-11-19 MED ORDER — SODIUM CHLORIDE 0.9 % IV SOLN
2.0000 g | INTRAVENOUS | Status: DC
  Administered 2017-11-19 – 2017-11-21 (×3): 2 g via INTRAVENOUS
  Filled 2017-11-19: qty 2
  Filled 2017-11-19 (×3): qty 20

## 2017-11-19 NOTE — MAU Note (Signed)
Pt reports to MAU c/o lower abodominal pain and fever. Pt states yesterday she was feeling bad and took her temp and it was 101. Pt took tylenol and was able to rest pt started to feel better. Pt states she felt fine this morning but this evening her symptoms returned and she has a sharp shooting pain in her lower abdomen pt reports the pain is a 10/10. Pt has had nausea and 3 bouts of diarrhea. Pt is c/o SOB but denies chest pain. Pt reports +FM. No bleeding or LOF but reports a vaginal discharge that is white no itching or burning with the discharge.

## 2017-11-19 NOTE — H&P (Signed)
Chief Complaint:  Abdominal Pain and Fever   First Provider Initiated Contact with Patient 11/19/17 2121     HPI: [name] [name] is a 22 y.o. G1P0000 at 43w3dwho presents to maternity admissions via EMS reporting fever/chills & flank pain. Symptoms began yesterday with chills & body aches. Temp up to 101 at home. Has been taking tylenol for fever, last dose was at 4 pm. Endorses bilateral flank pain and nausea. Denies contractions, dysuria, vaginal bleeding, LOF, or hematuria.  Positive fetal movement.   Location: bilateral flank Quality: sharp, shooting Severity: 10/10 in pain scale Duration: 2 days Timing: constant Modifying factors: none Associated signs and symptoms: fever, nausea        Past Medical History:  Diagnosis Date  . Asthma   . Diabetes type 1, uncontrolled ([place]   . Heart murmur                    OB History  Gravida Para Term Preterm AB Living  1 0 0 0 0 0  SAB TAB Ectopic Multiple Live Births     0 0 0 0 0       # Outcome Date GA Lbr Len/2nd Weight Sex Delivery Anes PTL Lv  1 Current                 Past Surgical History:  Procedure Laterality Date  . NO PAST SURGERIES          Family History  Problem Relation Age of Onset  . Diabetes Father   . Diabetes Maternal Grandmother   . Diabetes Paternal Grandmother   . Asthma Brother    Social History       Tobacco Use  . Smoking status: Never Smoker  . Smokeless tobacco: Never Used  Substance Use Topics  . Alcohol use: Not Currently  . Drug use: Not Currently        Allergies  Allergen Reactions  . Banana Itching and Swelling    Throat swelling, difficulty breathing          Medications Prior to Admission  Medication Sig Dispense Refill Last Dose  . ACCU-CHEK FASTCLIX LANCETS MISC 1 Units by Percutaneous route 4 (four) times daily. 100 each 12 11/19/2017 at Unknown time  . acetaminophen (TYLENOL) 500 MG tablet Take 1,000 mg by mouth every 6 (six) hours as  needed.   11/19/2017 at Unknown time  . aspirin 81 MG chewable tablet Chew 1 tablet (81 mg total) by mouth daily. 60 tablet 2 11/19/2017 at Unknown time  . Blood Glucose Monitoring Suppl (ACCU-CHEK NANO SMARTVIEW) w/Device KIT 1 kit by Subdermal route as directed. Check blood sugars for fasting, and two hours after breakfast, lunch and dinner (4 checks daily) 1 kit 0 11/19/2017 at Unknown time  . glucose blood (ACCU-CHEK SMARTVIEW) test strip Use as instructed to check blood sugars 100 each 12 11/19/2017 at Unknown time  . insulin aspart (NOVOLOG) 100 UNIT/ML injection Inject 0-20 Units into the skin 3 (three) times daily before meals. Pt takes an amount thay is necessary ,depending on her sugar level and carb counting Carb Counting ratio: 1:8 Correction: 1:40 10 mL 11 11/19/2017 at Unknown time  . insulin glargine (LANTUS) 100 UNIT/ML injection Inject 0.2 mLs (20 Units total) into the skin 2 (two) times daily. 10 mL 11 11/19/2017 at Unknown time  . Prenatal Vit-Fe Fumarate-FA (PRENATAL MULTIVITAMIN) TABS tablet Take 1 tablet by mouth daily at 12 noon.   11/19/2017 at Unknown time  I have reviewed patient's Past Medical Hx, Surgical Hx, Family Hx, Social Hx, medications and allergies.   ROS:  Review of Systems  Constitutional: Positive for chills and fever.  Gastrointestinal: Positive for abdominal pain and nausea. Negative for diarrhea and vomiting.  Genitourinary: Positive for flank pain. Negative for dysuria, frequency, hematuria, vaginal bleeding and vaginal discharge.  Musculoskeletal: Positive for myalgias.    Physical Exam   Patient Vitals for the past 24 hrs:  BP Temp Pulse Resp SpO2  11/19/17 2119 117/61 99.3 F (37.4 C) (!) 139 (!) 36 -  11/19/17 2118 - - - - 99 %    Constitutional: Well-developed, well-nourished female in no acute distress.  Cardiovascular: tachycardic, normal rhythm, no murmur Respiratory: normal effort, lung sounds clear throughout GI: + left CVAT. Abd  soft, non-tender, gravid appropriate for gestational age. Pos BS       x 4 MS: Extremities nontender, no edema, normal ROM Neurologic: Alert and oriented x 4.  GU:      Dilation: Closed Exam by:: Marylee Belzer np   NST:  Baseline: 170 bpm, Variability: Good {> 6 bpm), Accelerations: 15x15 and Decelerations: Absent   Labs: LabResultsLast24Hours  No results found for this or any previous visit (from the past 24 hour(s)).    Imaging:  No results found.  MAU Course:    Orders Placed This Encounter  Procedures  . Urinalysis, Routine w reflex microscopic  . CBC with Differential/Platelet  . Comprehensive metabolic panel  . Lactic acid, plasma       Meds ordered this encounter  Medications  . ondansetron (ZOFRAN) injection 4 mg  . FOLLOWED BY Linked Order Group   . sodium chloride 0.9 % bolus 1,000 mL   . sodium chloride 0.9 % bolus 1,000 mL    MDM: Per review of records, pt had positive urine culture last week & rx was sent in today; VM left regarding results but pt states she didn't get it.  Maternal & fetal tachycardia. IV fluids ordered Ctx on monitor; cervix closed/thick + left CVAT  C/w Dr. Elly Modena. Will admit for treatment of pyelonephritis  Assessment: 1. Pyelonephritis affecting pregnancy in third trimester   2. [redacted] weeks gestation of pregnancy     Plan: Admit to 3rd floor IV antibiotics Labs pending   Jorje Guild, NP 11/19/2017 9:47 PM

## 2017-11-19 NOTE — MAU Provider Note (Signed)
Chief Complaint:  Abdominal Pain and Fever   First Provider Initiated Contact with Patient 11/19/17 2121     HPI: [name] [name] is a 22 y.o. G1P0000 at 105w3dwho presents to maternity admissions via EMS reporting fever/chills & flank pain. Symptoms began yesterday with chills & body aches. Temp up to 101 at home. Has been taking tylenol for fever, last dose was at 4 pm. Endorses bilateral flank pain and nausea. Denies contractions, dysuria, vaginal bleeding, LOF, or hematuria.  Positive fetal movement.   Location: bilateral flank Quality: sharp, shooting Severity: 10/10 in pain scale Duration: 2 days Timing: constant Modifying factors: none Associated signs and symptoms: fever, nausea    Past Medical History:  Diagnosis Date  . Asthma   . Diabetes type 1, uncontrolled ([place]   . Heart murmur    OB History  Gravida Para Term Preterm AB Living  1 0 0 0 0 0  SAB TAB Ectopic Multiple Live Births  0 0 0 0 0    # Outcome Date GA Lbr Len/2nd Weight Sex Delivery Anes PTL Lv  1 Current            Past Surgical History:  Procedure Laterality Date  . NO PAST SURGERIES     Family History  Problem Relation Age of Onset  . Diabetes Father   . Diabetes Maternal Grandmother   . Diabetes Paternal Grandmother   . Asthma Brother    Social History   Tobacco Use  . Smoking status: Never Smoker  . Smokeless tobacco: Never Used  Substance Use Topics  . Alcohol use: Not Currently  . Drug use: Not Currently   Allergies  Allergen Reactions  . Banana Itching and Swelling    Throat swelling, difficulty breathing   Medications Prior to Admission  Medication Sig Dispense Refill Last Dose  . ACCU-CHEK FASTCLIX LANCETS MISC 1 Units by Percutaneous route 4 (four) times daily. 100 each 12 11/19/2017 at Unknown time  . acetaminophen (TYLENOL) 500 MG tablet Take 1,000 mg by mouth every 6 (six) hours as needed.   11/19/2017 at Unknown time  . aspirin 81 MG chewable tablet Chew 1 tablet (81 mg  total) by mouth daily. 60 tablet 2 11/19/2017 at Unknown time  . Blood Glucose Monitoring Suppl (ACCU-CHEK NANO SMARTVIEW) w/Device KIT 1 kit by Subdermal route as directed. Check blood sugars for fasting, and two hours after breakfast, lunch and dinner (4 checks daily) 1 kit 0 11/19/2017 at Unknown time  . glucose blood (ACCU-CHEK SMARTVIEW) test strip Use as instructed to check blood sugars 100 each 12 11/19/2017 at Unknown time  . insulin aspart (NOVOLOG) 100 UNIT/ML injection Inject 0-20 Units into the skin 3 (three) times daily before meals. Pt takes an amount thay is necessary ,depending on her sugar level and carb counting Carb Counting ratio: 1:8 Correction: 1:40 10 mL 11 11/19/2017 at Unknown time  . insulin glargine (LANTUS) 100 UNIT/ML injection Inject 0.2 mLs (20 Units total) into the skin 2 (two) times daily. 10 mL 11 11/19/2017 at Unknown time  . Prenatal Vit-Fe Fumarate-FA (PRENATAL MULTIVITAMIN) TABS tablet Take 1 tablet by mouth daily at 12 noon.   11/19/2017 at Unknown time    I have reviewed patient's Past Medical Hx, Surgical Hx, Family Hx, Social Hx, medications and allergies.   ROS:  Review of Systems  Constitutional: Positive for chills and fever.  Gastrointestinal: Positive for abdominal pain and nausea. Negative for diarrhea and vomiting.  Genitourinary: Positive for flank pain. Negative  for dysuria, frequency, hematuria, vaginal bleeding and vaginal discharge.  Musculoskeletal: Positive for myalgias.    Physical Exam   Patient Vitals for the past 24 hrs:  BP Temp Pulse Resp SpO2  11/19/17 2119 117/61 99.3 F (37.4 C) (!) 139 (!) 36 -  11/19/17 2118 - - - - 99 %    Constitutional: Well-developed, well-nourished female in no acute distress.  Cardiovascular: tachycardic, normal rhythm, no murmur Respiratory: normal effort, lung sounds clear throughout GI: + left CVAT. Abd soft, non-tender, gravid appropriate for gestational age. Pos BS  x 4 MS: Extremities nontender, no  edema, normal ROM Neurologic: Alert and oriented x 4.  GU:      Dilation: Closed Exam by:: Xoey Warmoth np   NST:  Baseline: 170 bpm, Variability: Good {> 6 bpm), Accelerations: 15x15 and Decelerations: Absent   Labs: No results found for this or any previous visit (from the past 24 hour(s)).  Imaging:  No results found.  MAU Course: Orders Placed This Encounter  Procedures  . Urinalysis, Routine w reflex microscopic  . CBC with Differential/Platelet  . Comprehensive metabolic panel  . Lactic acid, plasma   Meds ordered this encounter  Medications  . ondansetron (ZOFRAN) injection 4 mg  . FOLLOWED BY Linked Order Group   . sodium chloride 0.9 % bolus 1,000 mL   . sodium chloride 0.9 % bolus 1,000 mL    MDM: Per review of records, pt had positive urine culture last week & rx was sent in today; VM left regarding results but pt states she didn't get it.  Maternal & fetal tachycardia. IV fluids ordered Ctx on monitor; cervix closed/thick + left CVAT  C/w Dr. Elly Modena. Will admit for treatment of pyelonephritis  Assessment: 1. Pyelonephritis affecting pregnancy in third trimester   2. [redacted] weeks gestation of pregnancy     Plan: Admit to 3rd floor IV antibiotics Labs pending   Jorje Guild, NP 11/19/2017 9:47 PM

## 2017-11-19 NOTE — Telephone Encounter (Addendum)
-----   Message from Chancy Milroy, MD sent at 11/19/2017 10:26 AM EDT ----- Keflex 500 mg po tid x 7 days for UTI Thanks Legrand Como  LM for pt that we have sent in an Rx for a UTI to her [facility] on [address].  If she has any questions to please give the office a call.

## 2017-11-20 LAB — GLUCOSE, CAPILLARY
GLUCOSE-CAPILLARY: 113 mg/dL — AB (ref 70–99)
GLUCOSE-CAPILLARY: 131 mg/dL — AB (ref 70–99)
GLUCOSE-CAPILLARY: 140 mg/dL — AB (ref 70–99)
GLUCOSE-CAPILLARY: 157 mg/dL — AB (ref 70–99)
GLUCOSE-CAPILLARY: 158 mg/dL — AB (ref 70–99)
Glucose-Capillary: 124 mg/dL — ABNORMAL HIGH (ref 70–99)
Glucose-Capillary: 150 mg/dL — ABNORMAL HIGH (ref 70–99)
Glucose-Capillary: 210 mg/dL — ABNORMAL HIGH (ref 70–99)
Glucose-Capillary: 168 mg/dL — ABNORMAL HIGH (ref 70–99)

## 2017-11-20 LAB — URINALYSIS, ROUTINE W REFLEX MICROSCOPIC
BACTERIA UA: NONE SEEN
BILIRUBIN URINE: NEGATIVE
Glucose, UA: >=500 mg/dL — AB
HGB URINE DIPSTICK: NEGATIVE
Ketones, ur: 80 mg/dL — AB
Leukocytes, UA: NEGATIVE
NITRITE: NEGATIVE
Protein, ur: 30 mg/dL — AB
SPECIFIC GRAVITY, URINE: 1.013 (ref 1.005–1.030)
pH: 6 (ref 5.0–8.0)

## 2017-11-20 MED ORDER — INSULIN ASPART 100 UNIT/ML ~~LOC~~ SOLN
3.0000 [IU] | Freq: Three times a day (TID) | SUBCUTANEOUS | Status: DC
  Administered 2017-11-20 – 2017-11-22 (×5): 3 [IU] via SUBCUTANEOUS

## 2017-11-20 MED ORDER — INSULIN ASPART 100 UNIT/ML ~~LOC~~ SOLN
0.0000 [IU] | Freq: Three times a day (TID) | SUBCUTANEOUS | Status: DC
  Administered 2017-11-20: 2 [IU] via SUBCUTANEOUS
  Administered 2017-11-21: 3 [IU] via SUBCUTANEOUS
  Administered 2017-11-21: 4 [IU] via SUBCUTANEOUS
  Administered 2017-11-21: 2 [IU] via SUBCUTANEOUS
  Administered 2017-11-22: 3 [IU] via SUBCUTANEOUS

## 2017-11-20 MED ORDER — INSULIN ASPART 100 UNIT/ML ~~LOC~~ SOLN
0.0000 [IU] | Freq: Three times a day (TID) | SUBCUTANEOUS | Status: DC
  Administered 2017-11-20: 2 [IU] via SUBCUTANEOUS

## 2017-11-20 NOTE — Progress Notes (Signed)
CBG 124 

## 2017-11-20 NOTE — Progress Notes (Signed)
Called Dr. Elly Modena re: patient's 9/10 pain upon waking (bilateral flank pain) and nausea. Dr. Elly Modena requested call back at this time.

## 2017-11-20 NOTE — Progress Notes (Signed)
CBG 158  

## 2017-11-20 NOTE — Progress Notes (Signed)
[place] NOTE  Betsaida Agent is a 22 y.o. G1P0000 at [redacted]w[redacted]d who is admitted for pyelonephritis.  Estimated Date of Delivery: 01/25/18 Fetal presentation is unsure.  Length of Stay:  1 Days. Admitted 11/19/2017  Subjective: Patient feeling slightly better, back pain is improved. Patient reports normal fetal movement.  She denies uterine contractions, denies bleeding and leaking of fluid per vagina.  Vitals:  Blood pressure 117/73, pulse (!) 116, temperature 99.2 F (37.3 C), temperature source Oral, resp. rate 20, height 5' (1.524 m), weight 88.9 kg, last menstrual period 04/11/2017, SpO2 97 %. Physical Examination: CONSTITUTIONAL: Well-developed, well-nourished female in no acute distress.  HENT:  Normocephalic, atraumatic, External right and left ear normal. Oropharynx is clear and moist EYES: Conjunctivae and EOM are normal. Pupils are equal, round, and reactive to light. No scleral icterus.  NECK: Normal range of motion, supple, no masses. SKIN: Skin is warm and dry. No rash noted. Not diaphoretic. No erythema. No pallor. [place]: Alert and oriented to person, place, and time. Normal reflexes, muscle tone coordination. No cranial nerve deficit noted. PSYCHIATRIC: Normal mood and affect. Normal behavior. Normal judgment and thought content. CARDIOVASCULAR: Normal heart rate noted, regular rhythm RESPIRATORY: Effort and breath sounds normal, no problems with respiration noted MUSCULOSKELETAL: Normal range of motion. No edema and no tenderness. ABDOMEN: Soft, nontender, nondistended, gravid. CERVIX: deferred  Fetal monitoring: last done at 3 am FHR: 140s bpm, Variability: moderate, Accelerations: Present, Decelerations: Absent  Uterine activity: irritable contractions per hour   No results found.  Current scheduled medications . aspirin  81 mg Oral Daily  . docusate sodium  100 mg Oral Daily  . enoxaparin (LOVENOX) injection  40 mg Subcutaneous Q24H  .  insulin aspart  0-14 Units Subcutaneous TID WC  . insulin aspart  3 Units Subcutaneous TID WC  . insulin glargine  20 Units Subcutaneous BID  . prenatal multivitamin  1 tablet Oral Q1200    I have reviewed the patient's current medications.  ASSESSMENT: Principal Problem:   Pyelonephritis affecting pregnancy in third trimester Active Problems:   DM (diabetes mellitus), type 1 ([place])   Supervision of high risk pregnancy, antepartum   Rh negative state in antepartum period   Severe obesity (BMI 35.0-39.9) with comorbidity ([place])   Polyhydramnios affecting pregnancy   PLAN: Maternal tachycardia improved Rocephin 2 g/day Cont home lantus 20 units BID, novolog 3 units TID + sliding scale with meals lovenox 40 mg daily Continue routine antenatal care.    Feliz Beam, M.D. [facility]  11/20/2017 11:22 AM

## 2017-11-20 NOTE — Progress Notes (Signed)
Inpatient Diabetes Program Recommendations  AACE/ADA: New Consensus Statement on Inpatient Glycemic Control (2015)  Target Ranges:  Prepandial:   less than 140 mg/dL      Peak postprandial:   less than 180 mg/dL (1-2 hours)      Critically ill patients:  140 - 180 mg/dL   Lab Results  Component Value Date   GLUCAP 113 (H) 11/20/2017   HGBA1C 8.3 (H) 10/08/2017    Review of Glycemic Control Results for [name] [name], [name] [name] (MRN [id number]) as of 11/20/2017 09:37  Ref. Range 11/20/2017 00:59 11/20/2017 06:44 11/20/2017 07:36  Glucose-Capillary Latest Ref Range: 70 - 99 mg/dL 158 (H) 124 (H) 113 (H)   Diabetes history: Type 1 DM Outpatient Diabetes medications: Lantus 20 units QHS, Novolog 0-20 per SSI TID, Novolog per 1:8 CHO ratio TID Current orders for Inpatient glycemic control: Lantus 20 units QHS, Novolog 0-20 units TID  Inpatient Diabetes Program Recommendations:    Blood sugars look good this AM.   Would recommending changing correction to Novolog 0-14 under the Diabetic pregnant patient order set (as 1 unit drops the patient 40 mg/dL). Patient at risk for hypoglycemia.  Additionally, per carb ratio of 1:8 grams, could add Novolog 4 units TID (assuming that patient is consuming >50% of meal) (also under Diabetic pregnant patient order set).   Spoke with patient on 10/08/17 and per note: "Patient was diagnosed 12 years ago with Type 1 DM. Has been [facility] by [facility] endocrinology, with last appointment being on 08/14/17. Patient reports administering insulin as prescribed and even takes correction insulin when not eating. Denies missing doses. Patient reports checking BS 4-5 times per day and denies frequent periods of hypoglycemia."    Thanks, Bronson Curb, MSN, RNC-OB Diabetes Coordinator [phone] (8a-5p)

## 2017-11-21 ENCOUNTER — Other Ambulatory Visit: Payer: Self-pay

## 2017-11-21 ENCOUNTER — Ambulatory Visit (HOSPITAL_COMMUNITY): Admission: RE | Admit: 2017-11-21 | Payer: Medicaid Other | Source: Ambulatory Visit

## 2017-11-21 ENCOUNTER — Inpatient Hospital Stay (HOSPITAL_BASED_OUTPATIENT_CLINIC_OR_DEPARTMENT_OTHER): Payer: Medicaid Other

## 2017-11-21 DIAGNOSIS — O24013 Pre-existing diabetes mellitus, type 1, in pregnancy, third trimester: Secondary | ICD-10-CM

## 2017-11-21 DIAGNOSIS — O403XX0 Polyhydramnios, third trimester, not applicable or unspecified: Secondary | ICD-10-CM

## 2017-11-21 DIAGNOSIS — O368130 Decreased fetal movements, third trimester, not applicable or unspecified: Secondary | ICD-10-CM

## 2017-11-21 DIAGNOSIS — O99212 Obesity complicating pregnancy, second trimester: Secondary | ICD-10-CM

## 2017-11-21 DIAGNOSIS — Z3A30 30 weeks gestation of pregnancy: Secondary | ICD-10-CM

## 2017-11-21 LAB — GLUCOSE, CAPILLARY
GLUCOSE-CAPILLARY: 69 mg/dL — AB (ref 70–99)
GLUCOSE-CAPILLARY: 125 mg/dL — AB (ref 70–99)
GLUCOSE-CAPILLARY: 119 mg/dL — AB (ref 70–99)
GLUCOSE-CAPILLARY: 129 mg/dL — AB (ref 70–99)
Glucose-Capillary: 66 mg/dL — ABNORMAL LOW (ref 70–99)
Glucose-Capillary: 166 mg/dL — ABNORMAL HIGH (ref 70–99)
Glucose-Capillary: 212 mg/dL — ABNORMAL HIGH (ref 70–99)

## 2017-11-21 MED ORDER — PROMETHAZINE HCL 25 MG/ML IJ SOLN
25.0000 mg | Freq: Once | INTRAMUSCULAR | Status: AC
  Administered 2017-11-21: 25 mg via INTRAVENOUS
  Filled 2017-11-21: qty 1

## 2017-11-21 NOTE — Progress Notes (Signed)
[place] NOTE  [name] [name] is a 22 y.o. G1P0000 at [redacted]w[redacted]d who is admitted for pyelonephritis.  Estimated Date of Delivery: 01/25/18 Fetal presentation is unsure.  Length of Stay:  2 Days. Admitted 11/19/2017  Subjective: Patient reports normal fetal movement.  She denies uterine contractions, denies bleeding and leaking of fluid per vagina. She is feeling better, back pain almost gone  Vitals:  Blood pressure 120/74, pulse (!) 108, temperature 98.3 F (36.8 C), temperature source Oral, resp. rate 20, height 5' (1.524 m), weight 88.9 kg, last menstrual period 04/11/2017, SpO2 97 %. Physical Examination: CONSTITUTIONAL: Well-developed, well-nourished female in no acute distress.  HENT:  Normocephalic, atraumatic, External right and left ear normal. Oropharynx is clear and moist EYES: Conjunctivae and EOM are normal. Pupils are equal, round, and reactive to light. No scleral icterus.  NECK: Normal range of motion, supple, no masses. SKIN: Skin is warm and dry. No rash noted. Not diaphoretic. No erythema. No pallor. [place]: Alert and oriented to person, place, and time. Normal reflexes, muscle tone coordination. No cranial nerve deficit noted. PSYCHIATRIC: Normal mood and affect. Normal behavior. Normal judgment and thought content. CARDIOVASCULAR: Normal heart rate noted, regular rhythm RESPIRATORY: Effort and breath sounds normal, no problems with respiration noted MUSCULOSKELETAL: Normal range of motion. No edema and no tenderness. ABDOMEN: Soft, nontender, nondistended, gravid. CERVIX: deferred  Fetal monitoring: FHR: 140 bpm, Variability: moderate, Accelerations: Present, Decelerations: Absent  Uterine activity:  No contractions per hour    Current scheduled medications . aspirin  81 mg Oral Daily  . docusate sodium  100 mg Oral Daily  . enoxaparin (LOVENOX) injection  40 mg Subcutaneous Q24H  . insulin aspart  0-14 Units Subcutaneous TID PC  .  insulin aspart  3 Units Subcutaneous TID WC  . insulin glargine  20 Units Subcutaneous BID  . prenatal multivitamin  1 tablet Oral Q1200    I have reviewed the patient's current medications.  ASSESSMENT: Principal Problem:   Pyelonephritis affecting pregnancy in third trimester Active Problems:   DM (diabetes mellitus), type 1 ([place])   Supervision of high risk pregnancy, antepartum   Rh negative state in antepartum period   Severe obesity (BMI 35.0-39.9) with comorbidity ([place])   Polyhydramnios affecting pregnancy   PLAN: Rocephin 2 g/day til 72 hrs abx Cont home lantus 20 units BID, novolog 3 units TID + sliding scale with meals lovenox 40 mg daily Continue routine antenatal care.    Feliz Beam, M.D. [facility]  11/21/2017 4:43 PM

## 2017-11-22 ENCOUNTER — Encounter: Payer: Self-pay | Admitting: *Deleted

## 2017-11-22 LAB — GLUCOSE, CAPILLARY
GLUCOSE-CAPILLARY: 123 mg/dL — AB (ref 70–99)
GLUCOSE-CAPILLARY: 169 mg/dL — AB (ref 70–99)

## 2017-11-22 MED ORDER — INSULIN GLARGINE 100 UNIT/ML ~~LOC~~ SOLN: 32.0000 [IU] | Freq: Every day | SUBCUTANEOUS | 2 refills | Status: DC

## 2017-11-22 MED ORDER — AMOXICILLIN-POT CLAVULANATE 875-125 MG PO TABS: 1.0000 | ORAL_TABLET | Freq: Two times a day (BID) | ORAL | 0 refills | Status: AC

## 2017-11-22 MED ORDER — CEPHALEXIN 500 MG PO CAPS: 500.0000 mg | ORAL_CAPSULE | Freq: Every day | ORAL | 3 refills | Status: DC

## 2017-11-22 NOTE — Progress Notes (Signed)
Patient discharged home with mother. Follow-up care reviewed; Medications discussed; Discharge instructions reviewed; Prescriptions reviewed; Pain management discussed; Educated about hypertension in pregnancy; Admission discussed. Patient verbalized understanding.

## 2017-11-22 NOTE — Discharge Summary (Signed)
Antenatal Physician Discharge Summary  Patient ID: [name] [name] MRN: [id number] DOB/AGE: [birth year] 22 y.o.  Admit date: 11/19/2017 Discharge date: 11/22/2017  Admission Diagnoses: pyelonephritis  Discharge Diagnoses:  Principal Problem:   Pyelonephritis affecting pregnancy in third trimester Active Problems:   DM (diabetes mellitus), type 1 ([place])   Supervision of high risk pregnancy, antepartum   Rh negative state in antepartum period   Severe obesity (BMI 35.0-39.9) with comorbidity ([place])   Polyhydramnios affecting pregnancy  Prenatal Procedures: NST and ultrasound  Consults: Maternal Fetal Medicine, Diabetes education  Hospital Course:  This is a 22 y.o. G1P0000 with IUP at 76w6dadmitted for pyelonephritis. She had UTI positive for E. Coli but did not start antibiotics, presented with severe back pain, fever/chills with reported temp to 101 at home. Also with maternal tachycardia. She was admitted and received IV antibiotics, urine culture came back sensitive to empiric antibiotics. Her symptoms improved and she was discharged home HD#3 after 72 hrs Rocephin. She will take 7 days augmentin and then switch to keflex nightly for remainder of pregnancy. Patient verbalizes understanding of the above.   She was also seen by diabetes coordinator for Type 1 DM and BG was reasonably well controlled. She was sent home on home regimen of insulin as she follows closely with endocrinology, who is managing her insulin dosing. She denies leaking/bleeding, cramping, contractions and reports normal fetal movement. She will f/u in 2 weeks for next OB visit and start antenatal testing at that time.   Discharge Exam: Temp:  [97.9 F (36.6 C)-98.9 F (37.2 C)] 98 F (36.7 C) (09/06 0806) Pulse Rate:  [101-115] 101 (09/06 0806) Resp:  [16-20] 16 (09/06 0806) BP: (99-120)/(53-74) 99/56 (09/06 0806) SpO2:  [95 %-99 %] 97 % (09/06 0806) Physical Examination: CONSTITUTIONAL: Well-developed,  well-nourished female in no acute distress.  HENT:  Normocephalic, atraumatic, External right and left ear normal. Oropharynx is clear and moist EYES: Conjunctivae and EOM are normal. Pupils are equal, round, and reactive to light. No scleral icterus.  NECK: Normal range of motion, supple, no masses SKIN: Skin is warm and dry. No rash noted. Not diaphoretic. No erythema. No pallor. NR[place]era Alert and oriented to person, place, and time. Normal reflexes, muscle tone coordination. No cranial nerve deficit noted. PSYCHIATRIC: Normal mood and affect. Normal behavior. Normal judgment and thought content. CARDIOVASCULAR: Normal heart rate noted, regular rhythm RESPIRATORY: Effort and breath sounds normal, no problems with respiration noted MUSCULOSKELETAL: Normal range of motion. No edema and no tenderness. 2+ distal pulses. ABDOMEN: Soft, nontender, nondistended, gravid. CERVIX: Dilation: Closed Exam by:: erin lawrence np   Fetal monitoring: FHR: 140s bpm, Variability: moderate, Accelerations: Present, Decelerations: Absent  Uterine activity: occasional contractions   Significant Diagnostic Studies:  ----------------------------------------------------------------------  OBSTETRICS REPORT                       (Signed Final 11/21/2017 03:42 pm) ---------------------------------------------------------------------- Patient Info  ID #:       [id number]                         D.O.B.:  011/11/1995(22 yrs)  Name:       [name] [name]                   Visit Date: 11/21/2017 02:54 pm ---------------------------------------------------------------------- Performed By  Performed By:     HNovella Rob       Ref. Address:  [place]  Attending:        Tama High MD        Location:         [facility]  Referred By:      Sloan Leiter                     MD ---------------------------------------------------------------------- Orders   #  Description                          Code         Ordered By   1  [place] MFM OB FOLLOW UP                  16109.60     Tania Ade  ----------------------------------------------------------------------   #  Order #                    Accession #                 Episode #   1  [id number]                  [id number]                  [id number]  ---------------------------------------------------------------------- Indications   [redacted] weeks gestation of pregnancy                Z3A.30   Pre-existing diabetes, type 1, in pregnancy,   O24.013   third trimester   Polyhydramnios, third trimester, antepartum    O40.3XX0   condition or complication, unspecified fetus   Decreased fetal movements, third trimester,    O36.8130   unspecified   Obesity complicating pregnancy, second         O99.212   trimester  ---------------------------------------------------------------------- Vital Signs                                                 Height:        5'0" ---------------------------------------------------------------------- Fetal Evaluation  Num Of Fetuses:         1  Fetal Heart Rate(bpm):  134  Cardiac Activity:       Observed  Presentation:           Cephalic  Placenta:               Posterior  P. Cord Insertion:      Previously Visualized  Amniotic Fluid  AFI FV:      Polyhydramnios  AFI Sum(cm)     %Tile       Largest Pocket(cm)  28.46           > 97        10.57  RUQ(cm)       RLQ(cm)       LUQ(cm)  LLQ(cm)  10.57         7.64          4.3            5.95 ---------------------------------------------------------------------- Biometry  BPD:      79.7  mm     G. Age:  32w 0d         77  %    CI:        80.85   %    70 - 86                                                          FL/HC:      20.8   %    19.3 - 21.3  HC:      279.9  mm     G. Age:  30w 5d         16  %    HC/AC:      0.92        0.96  - 1.17  AC:      305.7  mm     G. Age:  34w 4d       > 97  %    FL/BPD:     72.9   %    71 - 87  FL:       58.1  mm     G. Age:  30w 2d         27  %    FL/AC:      19.0   %    20 - 24  HUM:      52.3  mm     G. Age:  30w 3d         64  %  LV:        1.7  mm  Est. FW:    2037  gm      4 lb 8 oz     83  % ---------------------------------------------------------------------- OB History  Gravidity:    1 ---------------------------------------------------------------------- Gestational Age  Clinical EDD:  30w 5d                                        EDD:   01/25/18  U/S Today:     31w 6d                                        EDD:   01/17/18  Best:          30w 5d     Det. By:  Clinical EDD             EDD:   01/25/18 ---------------------------------------------------------------------- Anatomy  Cranium:               Appears normal         Aortic Arch:            Previously seen  Cavum:                 Appears normal         Ductal Arch:  Previously seen  Ventricles:            Appears normal         Diaphragm:              Previously seen  Choroid Plexus:        Previously seen        Stomach:                Appears normal, left                                                                        sided  Cerebellum:            Previously seen        Abdomen:                Appears normal  Posterior Fossa:       Previously seen        Abdominal Wall:         Previously seen  Nuchal Fold:           Not applicable (>31    Cord Vessels:           Previously seen                         wks GA)  Face:                  Orbits and profile     Kidneys:                Appear normal                         previously seen  Lips:                  Previously seen        Bladder:                Appears normal  Thoracic:              Appears normal         Spine:                  Previously seen  Heart:                 Previously seen        Upper Extremities:      Previously seen  RVOT:                   Previously seen        Lower Extremities:      Previously seen  LVOT:                  Previously seen  Other:  Heels, feet, open hands, and 5th digit previously visualized.          Technically difficult due to maternal habitus and fetal position ---------------------------------------------------------------------- Cervix Uterus Adnexa  Cervix  Not visualized (advanced GA >24wks) ---------------------------------------------------------------------- Impression  Patient is admitted with acute pyelonephritis. She feels well  now with no fever or back pain. Patient has  diabetes and  takes insulin.  Amniotic fluid is slightly increased (mild polyhydramnios).  Fetal growth is appropriate for the gestational age. Abdominal  circumference measures at greater than the 95th percentile. ---------------------------------------------------------------------- Recommendations  -Weekly antenatal testing from 32 weeks till delivery. ----------------------------------------------------------------------                  Tama High, MD Electronically Signed Final Report   11/21/2017 03:42 pm ----------------------------------------------------------------------  Discharge Condition: Stable  Disposition: Discharge disposition: 01-Home or Self Care      Discharge Instructions    Discharge instructions   Complete by:  As directed    Take your medications as directed.   Notify physician for a general feeling that "something is not right"   Complete by:  As directed    Notify physician for increase or change in vaginal discharge   Complete by:  As directed    Notify physician for intestinal cramps, with or without diarrhea, sometimes described as "gas pain"   Complete by:  As directed    Notify physician for leaking of fluid   Complete by:  As directed    Notify physician for low, dull backache, unrelieved by heat or Tylenol   Complete by:  As directed    Notify physician  for menstrual like cramps   Complete by:  As directed    Notify physician for pelvic pressure   Complete by:  As directed    Notify physician for uterine contractions.  These may be painless and feel like the uterus is tightening or the baby is  "balling up"   Complete by:  As directed    Notify physician for vaginal bleeding   Complete by:  As directed    PRETERM LABOR:  Includes any of the follwing symptoms that occur between 20 - [redacted] weeks gestation.  If these symptoms are not stopped, preterm labor can result in preterm delivery, placing your baby at risk   Complete by:  As directed      Allergies as of 11/22/2017      Reactions   Banana Itching, Swelling   Throat swelling, difficulty breathing      Medication List    TAKE these medications   ACCU-CHEK FASTCLIX LANCETS Misc 1 Units by Percutaneous route 4 (four) times daily.   ACCU-CHEK NANO SMARTVIEW w/Device Kit 1 kit by Subdermal route as directed. Check blood sugars for fasting, and two hours after breakfast, lunch and dinner (4 checks daily)   acetaminophen 500 MG tablet Commonly known as:  TYLENOL Take 1,000 mg by mouth every 6 (six) hours as needed.   amoxicillin-clavulanate 875-125 MG tablet Commonly known as:  AUGMENTIN Take 1 tablet by mouth 2 (two) times daily for 7 days.   aspirin 81 MG chewable tablet Chew 1 tablet (81 mg total) by mouth daily.   cephALEXin 500 MG capsule Commonly known as:  KEFLEX Take 1 capsule (500 mg total) by mouth daily.   glucose blood test strip Use as instructed to check blood sugars   insulin aspart 100 UNIT/ML injection Commonly known as:  novoLOG Inject 0-20 Units into the skin 3 (three) times daily before meals. Pt takes an amount thay is necessary ,depending on her sugar level and carb counting Carb Counting ratio: 1:8 Correction: 1:40   insulin glargine 100 UNIT/ML injection Commonly known as:  LANTUS Inject 0.32 mLs (32 Units total) into the skin at bedtime.    prenatal multivitamin Tabs tablet Take 1 tablet by mouth daily at  12 noon.      Follow-up [place] for [facility]. Go on 12/05/2017.   Specialty:  Obstetrics and Gynecology Why:  Go to your regularly scheduled appointment. Contact information: [place] [phone]          Signed: Feliz Beam, M.D. [facility]  11/22/2017, 11:35 AM

## 2017-11-23 LAB — TYPE AND SCREEN
ABO/RH(D): O NEG
ANTIBODY SCREEN: POSITIVE
UNIT DIVISION: 0
Unit division: 0

## 2017-11-23 LAB — BPAM RBC
BLOOD PRODUCT EXPIRATION DATE: 201909162359
BLOOD PRODUCT EXPIRATION DATE: 201909222359
UNIT TYPE AND RH: 9500
Unit Type and Rh: 9500

## 2017-12-05 ENCOUNTER — Ambulatory Visit: Payer: Self-pay

## 2017-12-05 ENCOUNTER — Ambulatory Visit (INDEPENDENT_AMBULATORY_CARE_PROVIDER_SITE_OTHER): Payer: Medicaid Other | Admitting: Family Medicine

## 2017-12-05 ENCOUNTER — Ambulatory Visit (INDEPENDENT_AMBULATORY_CARE_PROVIDER_SITE_OTHER): Payer: Medicaid Other | Admitting: *Deleted

## 2017-12-05 VITALS — BP 126/72 | HR 79 | Wt 191.6 lb

## 2017-12-05 DIAGNOSIS — O0990 Supervision of high risk pregnancy, unspecified, unspecified trimester: Secondary | ICD-10-CM

## 2017-12-05 DIAGNOSIS — O409XX0 Polyhydramnios, unspecified trimester, not applicable or unspecified: Secondary | ICD-10-CM

## 2017-12-05 DIAGNOSIS — O24013 Pre-existing diabetes mellitus, type 1, in pregnancy, third trimester: Secondary | ICD-10-CM

## 2017-12-05 NOTE — Progress Notes (Signed)

## 2017-12-05 NOTE — Progress Notes (Signed)
Pt had recent Inpt admission due to [place]. She reports slight decreased FM x3 days.

## 2017-12-05 NOTE — Patient Instructions (Signed)

## 2017-12-05 NOTE — Progress Notes (Signed)
   PRENATAL VISIT NOTE  Subjective:  [name] [name] is a 22 y.o. G1P0000 at [redacted]w[redacted]d being seen today for ongoing prenatal care.  She is currently monitored for the following issues for this high-risk pregnancy and has DM (diabetes mellitus), type 1 ([place]); Supervision of high risk pregnancy, antepartum; Rh negative state in antepartum period; Migraines; Severe obesity (BMI 35.0-39.9) with comorbidity ([place]); Obesity in pregnancy; UTI in pregnancy; DKA, type 1 ([place]); Polyhydramnios affecting pregnancy; Diabetes mellitus in pregnancy; Rh negative status during pregnancy in second trimester; and Pyelonephritis affecting pregnancy in third trimester on their problem list.  Patient reports no complaints.  Contractions: Irregular. Vag. Bleeding: None.  Movement: (!) Decreased. Denies leaking of fluid.   The following portions of the patient's history were reviewed and updated as appropriate: allergies, current medications, past family history, past medical history, past social history, past surgical history and problem list. Problem list updated.  Objective:   Vitals:   12/05/17 0826  BP: 126/72  Pulse: 79  Weight: 191 lb 9.6 oz (86.9 kg)    Fetal Status: Fetal Heart Rate (bpm): NST   Movement: (!) Decreased     General:  Alert, oriented and cooperative. Patient is in no acute distress.  Skin: Skin is warm and dry. No rash noted.   Cardiovascular: Normal heart rate noted  Respiratory: Normal respiratory effort, no problems with respiration noted  Abdomen: Soft, gravid, appropriate for gestational age.  Pain/Pressure: Present     Pelvic: Cervical exam deferred        Extremities: Normal range of motion.     Mental Status: Normal mood and affect. Normal behavior. Normal judgment and thought content.  U/s 9/5 vtx, poly 28.46, EFW 4 lb 8 oz (83%), AC > 97% NST:  Baseline: 140 bpm, Variability: Good {> 6 bpm), Accelerations: Non-reactive but appropriate for gestational age and Decelerations: Absent    BPP 8/8 today  Assessment and Plan:  Pregnancy: G1P0000 at [redacted]w[redacted]d  1. Pre-existing type 1 diabetes mellitus during pregnancy in third trimester No CBG log-BabyScripts is not working for her--date is messed up Bring log every visit. Followed by [facility]--consider pump--she has omnipod--would improve glycemic control. Benefits of this discussed at length.  2. Polyhydramnios affecting pregnancy AFI 25+ today  3. Supervision of high risk pregnancy, antepartum   Preterm labor symptoms and general obstetric precautions including but not limited to vaginal bleeding, contractions, leaking of fluid and fetal movement were reviewed in detail with the patient. Please refer to After Visit Summary for other counseling recommendations.  Return in 1 week (on 12/12/2017) for NST/BPP and HOB; 10/3  NST only and HOB - has [place] @ 1300.  Future Appointments  Date Time Provider [place]  12/12/2017  8:15 AM WOC-WOCA NST WOC-WOCA WOC  12/12/2017  9:15 AM Wood[place] Mode, MD WOC-WOCA WOC  12/19/2017  1:00 PM WH-MFC [place] 3 WH-MFCUS MFC-US    Donnamae Jude, MD

## 2017-12-09 ENCOUNTER — Telehealth: Payer: Self-pay | Admitting: Obstetrics & Gynecology

## 2017-12-09 NOTE — Telephone Encounter (Signed)
Called patient about her appointment change. She was not available, and the voicemail was not available either.

## 2017-12-12 ENCOUNTER — Ambulatory Visit (INDEPENDENT_AMBULATORY_CARE_PROVIDER_SITE_OTHER): Payer: Medicaid Other | Admitting: Obstetrics & Gynecology

## 2017-12-12 ENCOUNTER — Ambulatory Visit (INDEPENDENT_AMBULATORY_CARE_PROVIDER_SITE_OTHER): Payer: Medicaid Other | Admitting: *Deleted

## 2017-12-12 ENCOUNTER — Ambulatory Visit: Payer: Self-pay

## 2017-12-12 VITALS — BP 129/80 | HR 90 | Wt 202.6 lb

## 2017-12-12 DIAGNOSIS — O0993 Supervision of high risk pregnancy, unspecified, third trimester: Secondary | ICD-10-CM

## 2017-12-12 DIAGNOSIS — O409XX0 Polyhydramnios, unspecified trimester, not applicable or unspecified: Secondary | ICD-10-CM

## 2017-12-12 DIAGNOSIS — O24013 Pre-existing diabetes mellitus, type 1, in pregnancy, third trimester: Secondary | ICD-10-CM | POA: Diagnosis not present

## 2017-12-12 DIAGNOSIS — O0990 Supervision of high risk pregnancy, unspecified, unspecified trimester: Secondary | ICD-10-CM

## 2017-12-12 DIAGNOSIS — O403XX0 Polyhydramnios, third trimester, not applicable or unspecified: Secondary | ICD-10-CM | POA: Diagnosis not present

## 2017-12-12 MED ORDER — INSULIN GLARGINE 100 UNIT/ML ~~LOC~~ SOLN: 40.0000 [IU] | Freq: Every day | SUBCUTANEOUS | 2 refills | Status: DC

## 2017-12-12 NOTE — Progress Notes (Signed)
   PRENATAL VISIT NOTE  Subjective:  [name] [name] is a 22 y.o. G1P0000 at [redacted]w[redacted]d being seen today for ongoing prenatal care.  She is currently monitored for the following issues for this high-risk pregnancy and has DM (diabetes mellitus), type 1 ([place]); Supervision of high risk pregnancy, antepartum; Rh negative state in antepartum period; Migraines; Severe obesity (BMI 35.0-39.9) with comorbidity ([place]); Obesity in pregnancy; UTI in pregnancy; DKA, type 1 ([place]); Polyhydramnios affecting pregnancy; Diabetes mellitus in pregnancy; Rh negative status during pregnancy in second trimester; and Pyelonephritis affecting pregnancy in third trimester on their problem list.  Patient reports BG not controlled.  Contractions: Irregular. Vag. Bleeding: None.  Movement: (!) Decreased. Denies leaking of fluid.   The following portions of the patient's history were reviewed and updated as appropriate: allergies, current medications, past family history, past medical history, past social history, past surgical history and problem list. Problem list updated.  Objective:   Vitals:   12/12/17 0913  BP: 129/80  Pulse: 90  Weight: 202 lb 9.6 oz (91.9 kg)    Fetal Status: Fetal Heart Rate (bpm): NST   Movement: (!) Decreased     General:  Alert, oriented and cooperative. Patient is in no acute distress.  Skin: Skin is warm and dry. No rash noted.   Cardiovascular: Normal heart rate noted  Respiratory: Normal respiratory effort, no problems with respiration noted  Abdomen: Soft, gravid, appropriate for gestational age.  Pain/Pressure: Present     Pelvic: Cervical exam deferred        Extremities: Normal range of motion.     Mental Status: Normal mood and affect. Normal behavior. Normal judgment and thought content.   Assessment and Plan:  Pregnancy: G1P0000 at [redacted]w[redacted]d  1. Supervision of high risk pregnancy, antepartum   2. Polyhydramnios affecting pregnancy   3. Pre-existing type 1 diabetes mellitus  during pregnancy in third trimester Some high BG <400, FBS most >180 up to 300, no low values - insulin glargine (LANTUS) 100 UNIT/ML injection; Inject 0.4 mLs (40 Units total) into the skin at bedtime.  Dispense: 10 mL; Refill: 2 Uncontrolled diabetes, has appointment at [facility] endocrine 10/2 and for now I increased her lantus and urge compliance with meal coverage Preterm labor symptoms and general obstetric precautions including but not limited to vaginal bleeding, contractions, leaking of fluid and fetal movement were reviewed in detail with the patient. Please refer to After Visit Summary for other counseling recommendations.  Return in about 1 week (around 12/19/2017) for NST only @ 1415 (double book); 10/10 HOB and NST/BPP.  Future Appointments  Date Time Provider [place]  12/19/2017  1:00 PM WH-MFC [place] 3 WH-MFCUS MFC-US  Needs repeat BPP tomorrow in MFM for 6/10 BPP today  Emeterio Reeve, MD

## 2017-12-12 NOTE — Progress Notes (Signed)

## 2017-12-12 NOTE — Progress Notes (Signed)
Pt reports increased UC's x6 days.

## 2017-12-12 NOTE — Patient Instructions (Signed)
Type 1 or Type 2 Diabetes Mellitus During Pregnancy, Diagnosis Type 1 diabetes (type 1 diabetes mellitus) and type 2 diabetes (type 2 diabetes mellitus) are long-term (chronic) diseases. Your diabetes may be caused by one or both of these problems:  Your body does not make enough of a hormone called insulin.  Your body does not respond in a normal way to insulin that it makes.  Insulin lets sugars (glucose) go into cells in the body. This gives you energy. If you have diabetes, sugars cannot get into cells. This causes high blood sugar (hyperglycemia). If diabetes is treated, it may not hurt you or your baby. Your doctor will set treatment goals for you. In general, you should have these blood sugar levels:  After not eating for a long time (fasting): 95 mg/dL (5.3 mmol/L).  After meals (postprandial): ? One hour after a meal: at or below 140 mg/dL (7.8 mmol/L). ? Two hours after a meal: at or below 120 mg/dL (6.7 mmol/L).  A1c (hemoglobin A1c) level: 6-6.5%.  Follow these instructions at home: Questions to Ask Your Doctor  You may want to ask these questions:  Do I need to meet with a diabetes educator?  Where can I find a support group for people with diabetes?  What equipment will I need to care for myself at home?  What diabetes medicines do I need? When should I take them?  How often do I need to check my blood sugar?  What number can I call if I have questions?  When is my next doctor's visit?  General instructions  Take over-the-counter and prescription medicines only as told by your doctor.  Stay at a healthy weight during pregnancy.  Keep all follow-up visits as told by your doctor. This is important. Contact a doctor if:  Your blood sugar is at or above 240 mg/dL (13.3 mmol/L).  Your blood sugar is at or above 200 mg/dL (11.1 mmol/L), and you have ketones in your pee (urine).  You have been sick or have had a fever for 2 days or more, and you are not  getting better.  You have any of these problems for more than 6 hours: ? You cannot eat or drink. ? You feel sick to your stomach (nauseous). ? You throw up (vomit). ? You have watery poop (diarrhea). Get help right away if:  Your blood sugar is lower than 54 mg/dL (3 mmol/L).  You get confused.  You have trouble: ? Thinking clearly. ? Breathing.  Your baby moves less than normal.  You have: ? Moderate or large ketone levels in your pee (urine). ? Bleeding from your vagina. ? Unusual fluid coming from your vagina. ? Early contractions. These may feel like tightness in your belly. This information is not intended to replace advice given to you by your health care provider. Make sure you discuss any questions you have with your health care provider. Document Released: 06/27/2015 Document Revised: 01/18/2016 Document Reviewed: 04/08/2015 Elsevier Interactive Patient Education  2017 [employer].

## 2017-12-13 ENCOUNTER — Inpatient Hospital Stay (HOSPITAL_COMMUNITY)
Admission: AD | Admit: 2017-12-13 | Discharge: 2017-12-16 | DRG: 788 | Disposition: A | Discharge status: Home / Self Care | Payer: Medicaid Other | Attending: Obstetrics & Gynecology | Admitting: Obstetrics & Gynecology

## 2017-12-13 ENCOUNTER — Encounter (HOSPITAL_COMMUNITY)
Admission: AD | Disposition: A | Discharge status: Home / Self Care | Payer: Self-pay | Source: Home / Self Care | Attending: Obstetrics & Gynecology

## 2017-12-13 ENCOUNTER — Encounter (HOSPITAL_COMMUNITY): Payer: Self-pay | Admitting: Neonatal-Perinatal Medicine

## 2017-12-13 ENCOUNTER — Inpatient Hospital Stay (HOSPITAL_COMMUNITY): Payer: Medicaid Other | Admitting: Anesthesiology

## 2017-12-13 ENCOUNTER — Ambulatory Visit (HOSPITAL_BASED_OUTPATIENT_CLINIC_OR_DEPARTMENT_OTHER)
Admission: RE | Admit: 2017-12-13 | Discharge: 2017-12-13 | Disposition: A | Discharge status: Home / Self Care | Payer: Medicaid Other | Source: Ambulatory Visit | Attending: Obstetrics & Gynecology | Admitting: Obstetrics & Gynecology

## 2017-12-13 DIAGNOSIS — E109 Type 1 diabetes mellitus without complications: Secondary | ICD-10-CM

## 2017-12-13 DIAGNOSIS — E1065 Type 1 diabetes mellitus with hyperglycemia: Secondary | ICD-10-CM | POA: Diagnosis present

## 2017-12-13 DIAGNOSIS — O24013 Pre-existing diabetes mellitus, type 1, in pregnancy, third trimester: Secondary | ICD-10-CM

## 2017-12-13 DIAGNOSIS — O403XX0 Polyhydramnios, third trimester, not applicable or unspecified: Secondary | ICD-10-CM | POA: Diagnosis present

## 2017-12-13 DIAGNOSIS — Z6791 Unspecified blood type, Rh negative: Secondary | ICD-10-CM

## 2017-12-13 DIAGNOSIS — O3413 Maternal care for benign tumor of corpus uteri, third trimester: Secondary | ICD-10-CM | POA: Diagnosis present

## 2017-12-13 DIAGNOSIS — O99214 Obesity complicating childbirth: Secondary | ICD-10-CM | POA: Diagnosis present

## 2017-12-13 DIAGNOSIS — Z3A33 33 weeks gestation of pregnancy: Secondary | ICD-10-CM

## 2017-12-13 DIAGNOSIS — O0990 Supervision of high risk pregnancy, unspecified, unspecified trimester: Secondary | ICD-10-CM

## 2017-12-13 DIAGNOSIS — O2402 Pre-existing diabetes mellitus, type 1, in childbirth: Secondary | ICD-10-CM | POA: Diagnosis present

## 2017-12-13 DIAGNOSIS — D252 Subserosal leiomyoma of uterus: Secondary | ICD-10-CM | POA: Diagnosis present

## 2017-12-13 DIAGNOSIS — Z794 Long term (current) use of insulin: Secondary | ICD-10-CM

## 2017-12-13 DIAGNOSIS — O26899 Other specified pregnancy related conditions, unspecified trimester: Secondary | ICD-10-CM

## 2017-12-13 DIAGNOSIS — IMO0001 Reserved for inherently not codable concepts without codable children: Secondary | ICD-10-CM | POA: Diagnosis present

## 2017-12-13 DIAGNOSIS — O409XX0 Polyhydramnios, unspecified trimester, not applicable or unspecified: Secondary | ICD-10-CM | POA: Diagnosis present

## 2017-12-13 DIAGNOSIS — O26893 Other specified pregnancy related conditions, third trimester: Secondary | ICD-10-CM | POA: Diagnosis present

## 2017-12-13 DIAGNOSIS — O321XX0 Maternal care for breech presentation, not applicable or unspecified: Secondary | ICD-10-CM | POA: Diagnosis present

## 2017-12-13 DIAGNOSIS — Z98891 History of uterine scar from previous surgery: Secondary | ICD-10-CM

## 2017-12-13 DIAGNOSIS — O99210 Obesity complicating pregnancy, unspecified trimester: Secondary | ICD-10-CM | POA: Diagnosis present

## 2017-12-13 DIAGNOSIS — O24919 Unspecified diabetes mellitus in pregnancy, unspecified trimester: Secondary | ICD-10-CM | POA: Diagnosis present

## 2017-12-13 DIAGNOSIS — D219 Benign neoplasm of connective and other soft tissue, unspecified: Secondary | ICD-10-CM | POA: Diagnosis not present

## 2017-12-13 LAB — CBC
HEMATOCRIT: 34.6 % — AB (ref 36.0–46.0)
Hemoglobin: 11.6 g/dL — ABNORMAL LOW (ref 12.0–15.0)
MCH: 28.3 pg (ref 26.0–34.0)
MCHC: 33.5 g/dL (ref 30.0–36.0)
MCV: 84.4 fL (ref 78.0–100.0)
Platelets: 169 10*3/uL (ref 150–400)
RBC: 4.1 MIL/uL (ref 3.87–5.11)
RDW: 13 % (ref 11.5–15.5)
WBC: 6.6 10*3/uL (ref 4.0–10.5)

## 2017-12-13 LAB — COMPREHENSIVE METABOLIC PANEL
ALT: 10 U/L (ref 0–44)
AST: 12 U/L — ABNORMAL LOW (ref 15–41)
Albumin: 2.5 g/dL — ABNORMAL LOW (ref 3.5–5.0)
Alkaline Phosphatase: 87 U/L (ref 38–126)
Anion gap: 9 (ref 5–15)
BILIRUBIN TOTAL: 0.5 mg/dL (ref 0.3–1.2)
BUN: 15 mg/dL (ref 6–20)
CO2: 20 mmol/L — ABNORMAL LOW (ref 22–32)
CREATININE: 0.65 mg/dL (ref 0.44–1.00)
Calcium: 8.6 mg/dL — ABNORMAL LOW (ref 8.9–10.3)
Chloride: 104 mmol/L (ref 98–111)
GFR calc Af Amer: >60 mL/min (ref >60)
Glucose, Bld: 354 mg/dL — ABNORMAL HIGH (ref 70–99)
POTASSIUM: 4 mmol/L (ref 3.5–5.1)
Sodium: 133 mmol/L — ABNORMAL LOW (ref 135–145)
TOTAL PROTEIN: 5.9 g/dL — AB (ref 6.5–8.1)

## 2017-12-13 LAB — BLOOD GAS, ARTERIAL
Acid-base deficit: 3.1 mmol/L — ABNORMAL HIGH (ref 0.0–2.0)
Bicarbonate: 19.6 mmol/L — ABNORMAL LOW (ref 20.0–28.0)
DRAWN BY: 12507
FIO2: 0.21
O2 SAT: 96 %
PH ART: 7.442 (ref 7.350–7.450)
pCO2 arterial: 29.1 mmHg — ABNORMAL LOW (ref 32.0–48.0)
pO2, Arterial: 110 mmHg — ABNORMAL HIGH (ref 83.0–108.0)

## 2017-12-13 LAB — GLUCOSE, CAPILLARY
GLUCOSE-CAPILLARY: 400 mg/dL — AB (ref 70–99)
GLUCOSE-CAPILLARY: 280 mg/dL — AB (ref 70–99)
GLUCOSE-CAPILLARY: 151 mg/dL — AB (ref 70–99)
Glucose-Capillary: 402 mg/dL — ABNORMAL HIGH (ref 70–99)
Glucose-Capillary: 136 mg/dL — ABNORMAL HIGH (ref 70–99)

## 2017-12-13 SURGERY — Surgical Case
Anesthesia: Spinal | Wound class: Clean Contaminated

## 2017-12-13 MED ORDER — FENTANYL CITRATE (PF) 100 MCG/2ML IJ SOLN
INTRAMUSCULAR | Status: AC
  Filled 2017-12-13: qty 2

## 2017-12-13 MED ORDER — PHENYLEPHRINE 8 MG IN D5W 100 ML (0.08MG/ML) PREMIX OPTIME
INJECTION | INTRAVENOUS | Status: DC | PRN
  Administered 2017-12-13: 60 ug/min via INTRAVENOUS

## 2017-12-13 MED ORDER — CEFAZOLIN SODIUM-DEXTROSE 2-4 GM/100ML-% IV SOLN
2.0000 g | INTRAVENOUS | Status: AC
  Administered 2017-12-13: 2 g via INTRAVENOUS

## 2017-12-13 MED ORDER — LACTATED RINGERS IV BOLUS: 1000.0000 mL | Freq: Once | INTRAVENOUS | Status: DC

## 2017-12-13 MED ORDER — INSULIN ASPART 100 UNIT/ML ~~LOC~~ SOLN
0.0000 [IU] | SUBCUTANEOUS | Status: DC
  Administered 2017-12-13: 20 [IU] via SUBCUTANEOUS

## 2017-12-13 MED ORDER — LACTATED RINGERS IV SOLN
INTRAVENOUS | Status: DC | PRN
  Administered 2017-12-13: 22:00:00 via INTRAVENOUS

## 2017-12-13 MED ORDER — SOD CITRATE-CITRIC ACID 500-334 MG/5ML PO SOLN
30.0000 mL | Freq: Once | ORAL | Status: AC
  Administered 2017-12-13: 30 mL via ORAL

## 2017-12-13 MED ORDER — BETAMETHASONE SOD PHOS & ACET 6 (3-3) MG/ML IJ SUSP
12.0000 mg | INTRAMUSCULAR | Status: DC
  Administered 2017-12-13: 12 mg via INTRAMUSCULAR
  Filled 2017-12-13: qty 2

## 2017-12-13 MED ORDER — ACETAMINOPHEN 325 MG PO TABS: 325.0000 mg | ORAL_TABLET | ORAL | Status: DC | PRN

## 2017-12-13 MED ORDER — BUPIVACAINE IN DEXTROSE 0.75-8.25 % IT SOLN
INTRATHECAL | Status: DC | PRN
  Administered 2017-12-13: 10 mg via INTRATHECAL

## 2017-12-13 MED ORDER — MORPHINE SULFATE (PF) 0.5 MG/ML IJ SOLN
INTRAMUSCULAR | Status: AC
  Filled 2017-12-13: qty 10

## 2017-12-13 MED ORDER — CEFAZOLIN SODIUM-DEXTROSE 2-4 GM/100ML-% IV SOLN
INTRAVENOUS | Status: AC
  Filled 2017-12-13: qty 100

## 2017-12-13 MED ORDER — INSULIN ASPART 100 UNIT/ML ~~LOC~~ SOLN
8.0000 [IU] | Freq: Once | SUBCUTANEOUS | Status: AC
  Administered 2017-12-13: 8 [IU] via SUBCUTANEOUS

## 2017-12-13 MED ORDER — LACTATED RINGERS IV BOLUS
1000.0000 mL | Freq: Once | INTRAVENOUS | Status: AC
  Administered 2017-12-13: 1000 mL via INTRAVENOUS

## 2017-12-13 MED ORDER — OXYCODONE HCL 5 MG/5ML PO SOLN: 5.0000 mg | Freq: Once | ORAL | Status: DC | PRN

## 2017-12-13 MED ORDER — OXYTOCIN 10 UNIT/ML IJ SOLN
INTRAVENOUS | Status: DC | PRN
  Administered 2017-12-13: 40 [IU] via INTRAVENOUS

## 2017-12-13 MED ORDER — PHENYLEPHRINE 8 MG IN D5W 100 ML (0.08MG/ML) PREMIX OPTIME
INJECTION | INTRAVENOUS | Status: AC
  Filled 2017-12-13: qty 100

## 2017-12-13 MED ORDER — ACETAMINOPHEN 160 MG/5ML PO SOLN: 325.0000 mg | ORAL | Status: DC | PRN

## 2017-12-13 MED ORDER — MORPHINE SULFATE (PF) 0.5 MG/ML IJ SOLN
INTRAMUSCULAR | Status: DC | PRN
  Administered 2017-12-13: .15 mg via INTRATHECAL

## 2017-12-13 MED ORDER — ONDANSETRON HCL 4 MG/2ML IJ SOLN: 4.0000 mg | Freq: Once | INTRAMUSCULAR | Status: DC | PRN

## 2017-12-13 MED ORDER — CEFAZOLIN SODIUM-DEXTROSE 2-4 GM/100ML-% IV SOLN: 2.0000 g | INTRAVENOUS | Status: DC

## 2017-12-13 MED ORDER — ONDANSETRON HCL 4 MG/2ML IJ SOLN
INTRAMUSCULAR | Status: AC
  Filled 2017-12-13: qty 2

## 2017-12-13 MED ORDER — SODIUM CHLORIDE 0.9 % IR SOLN
Status: DC | PRN
  Administered 2017-12-13: 1000 mL

## 2017-12-13 MED ORDER — OXYCODONE HCL 5 MG PO TABS: 5.0000 mg | ORAL_TABLET | Freq: Once | ORAL | Status: DC | PRN

## 2017-12-13 MED ORDER — MEPERIDINE HCL 25 MG/ML IJ SOLN: 6.2500 mg | INTRAMUSCULAR | Status: DC | PRN

## 2017-12-13 MED ORDER — FENTANYL CITRATE (PF) 100 MCG/2ML IJ SOLN: 25.0000 ug | INTRAMUSCULAR | Status: DC | PRN

## 2017-12-13 MED ORDER — OXYTOCIN 10 UNIT/ML IJ SOLN
INTRAMUSCULAR | Status: AC
  Filled 2017-12-13: qty 4

## 2017-12-13 MED ORDER — ONDANSETRON HCL 4 MG/2ML IJ SOLN
INTRAMUSCULAR | Status: DC | PRN
  Administered 2017-12-13: 4 mg via INTRAVENOUS

## 2017-12-13 MED ORDER — LACTATED RINGERS IV SOLN: INTRAVENOUS | Status: DC

## 2017-12-13 MED ORDER — SCOPOLAMINE 1 MG/3DAYS TD PT72
MEDICATED_PATCH | TRANSDERMAL | Status: DC | PRN
  Administered 2017-12-13: 1 via TRANSDERMAL

## 2017-12-13 MED ORDER — SCOPOLAMINE 1 MG/3DAYS TD PT72
MEDICATED_PATCH | TRANSDERMAL | Status: AC
  Filled 2017-12-13: qty 1

## 2017-12-13 MED ORDER — FENTANYL CITRATE (PF) 100 MCG/2ML IJ SOLN
INTRAMUSCULAR | Status: DC | PRN
  Administered 2017-12-13: 15 ug via INTRATHECAL

## 2017-12-13 MED ORDER — FAMOTIDINE IN NACL 20-0.9 MG/50ML-% IV SOLN
20.0000 mg | Freq: Once | INTRAVENOUS | Status: AC
  Administered 2017-12-13: 20 mg via INTRAVENOUS
  Filled 2017-12-13: qty 50

## 2017-12-13 MED ORDER — DEXTROSE-NACL 5-0.45 % IV SOLN
INTRAVENOUS | Status: DC
  Administered 2017-12-13 (×2): via INTRAVENOUS

## 2017-12-13 MED ORDER — SOD CITRATE-CITRIC ACID 500-334 MG/5ML PO SOLN
30.0000 mL | ORAL | Status: AC
  Administered 2017-12-13: 30 mL via ORAL
  Filled 2017-12-13: qty 15

## 2017-12-13 SURGICAL SUPPLY — 35 items
BARRIER ADHS 3X4 INTERCEED (GAUZE/BANDAGES/DRESSINGS) IMPLANT
CHLORAPREP W/TINT 26ML (MISCELLANEOUS) ×3 IMPLANT
CLAMP CORD UMBIL (MISCELLANEOUS) IMPLANT
CLOTH BEACON ORANGE TIMEOUT ST (SAFETY) ×3 IMPLANT
DERMABOND ADVANCED (GAUZE/BANDAGES/DRESSINGS) ×2
DERMABOND ADVANCED .7 DNX12 (GAUZE/BANDAGES/DRESSINGS) ×1 IMPLANT
DRSG OPSITE POSTOP 4X10 (GAUZE/BANDAGES/DRESSINGS) ×3 IMPLANT
ELECT REM PT RETURN 9FT ADLT (ELECTROSURGICAL) ×3
ELECTRODE REM PT RTRN 9FT ADLT (ELECTROSURGICAL) ×1 IMPLANT
EXTRACTOR VACUUM KIWI (MISCELLANEOUS) IMPLANT
GLOVE BIO SURGEON STRL SZ 6.5 (GLOVE) ×2 IMPLANT
GLOVE BIO SURGEONS STRL SZ 6.5 (GLOVE) ×1
GLOVE BIOGEL PI IND STRL 7.0 (GLOVE) ×2 IMPLANT
GLOVE BIOGEL PI INDICATOR 7.0 (GLOVE) ×4
GOWN STRL REUS W/TWL LRG LVL3 (GOWN DISPOSABLE) ×6 IMPLANT
HOVERMATT SINGLE USE (MISCELLANEOUS) ×3 IMPLANT
KIT ABG SYR 3ML LUER SLIP (SYRINGE) IMPLANT
NEEDLE HYPO 22GX1.5 SAFETY (NEEDLE) IMPLANT
NEEDLE HYPO 25X5/8 SAFETYGLIDE (NEEDLE) IMPLANT
NS IRRIG 1000ML POUR BTL (IV SOLUTION) ×3 IMPLANT
PACK C SECTION WH (CUSTOM PROCEDURE TRAY) ×3 IMPLANT
PAD OB MATERNITY 4.3X12.25 (PERSONAL CARE ITEMS) ×3 IMPLANT
PENCIL SMOKE EVAC W/HOLSTER (ELECTROSURGICAL) ×3 IMPLANT
RETRACTOR TRAXI PANNICULUS (MISCELLANEOUS) ×1 IMPLANT
RETRACTOR WND ALEXIS 25 LRG (MISCELLANEOUS) IMPLANT
RTRCTR WOUND ALEXIS 25CM LRG (MISCELLANEOUS)
SPONGE LAP 18X18 RF (DISPOSABLE) ×9 IMPLANT
SUT VIC AB 0 CT1 36 (SUTURE) ×18 IMPLANT
SUT VIC AB 2-0 CT1 27 (SUTURE) ×2
SUT VIC AB 2-0 CT1 TAPERPNT 27 (SUTURE) ×1 IMPLANT
SUT VIC AB 4-0 PS2 27 (SUTURE) ×3 IMPLANT
SYR CONTROL 10ML LL (SYRINGE) IMPLANT
TOWEL OR 17X24 6PK STRL BLUE (TOWEL DISPOSABLE) ×3 IMPLANT
TRAXI PANNICULUS RETRACTOR (MISCELLANEOUS) ×2
TRAY FOLEY W/BAG SLVR 14FR LF (SET/KITS/TRAYS/PACK) IMPLANT

## 2017-12-13 NOTE — Transfer of Care (Signed)
Immediate Anesthesia Transfer of Care Note  Patient: [name] [name]  Procedure(s) Performed: CESAREAN SECTION (N/A )  Patient Location: PACU  Anesthesia Type:Spinal  Level of Consciousness: awake  Airway & Oxygen Therapy: Patient Spontanous Breathing  Post-op Assessment: Report given to RN and Post -op Vital signs reviewed and stable  Post vital signs: stable  Last Vitals:  Vitals Value Taken Time  BP 109/68 12/13/2017 11:00 PM  Temp    Pulse 82 12/13/2017 11:01 PM  Resp 14 12/13/2017 11:01 PM  SpO2 98 % 12/13/2017 11:01 PM  Vitals shown include unvalidated device data.  Last Pain:  Vitals:   12/13/17 1554  TempSrc: Oral  PainSc: 6          Complications: No apparent anesthesia complications

## 2017-12-13 NOTE — H&P (Addendum)
[name] [name] is a 22 y.o. female presenting for evaluation with abnormal fetal testing in MAU today . BPP was 6/10 yesterday and was repeated today in MFM with score of 2/8. Marland Kitchen OB History    Gravida  1   Para  0   Term  0   Preterm  0   AB  0   Living  0     SAB  0   TAB  0   Ectopic  0   Multiple  0   Live Births  0          Past Medical History:  Diagnosis Date  . Asthma    Per pt. dx many yrs. ago, does not use meds for asthma  . Diabetes type 1, uncontrolled ([place])   . Heart murmur    Past Surgical History:  Procedure Laterality Date  . NO PAST SURGERIES     Family History: family history includes Asthma in her brother; Diabetes in her father, maternal grandmother, and paternal grandmother. Social History:  reports that she has never smoked. She has never used smokeless tobacco. She reports that she drank alcohol. She reports that she has current or past drug history.     Maternal Diabetes: Yes:  Diabetes Type:  Pre-pregnancy, Insulin/Medication controlled Genetic Screening: Declined Maternal Ultrasounds/Referrals: Abnormal:  Findings:   Other:polyhydramnios Fetal Ultrasounds or other Referrals:  Echo normal Maternal Substance Abuse:  No Significant Maternal Medications:  Meds include: Other: insulin Significant Maternal Lab Results:  Lab values include: Other: elevated BG 400 Other Comments:  uncontrolled diabetes  ROS Maternal Medical History:  Reason for admission: BPP 2/8  Contractions: Frequency: irregular.   Perceived severity is mild.    Fetal activity: Perceived fetal activity is decreased.   Last perceived fetal movement was within the past hour.    Prenatal complications: Polyhydramnios.   Prenatal Complications - Diabetes: type 1. Diabetes is managed by insulin injections and intensive insulin program.        Temperature 98.5 F (36.9 C), temperature source Oral, last menstrual period 04/11/2017. Maternal Exam:  Uterine  Assessment: Contraction strength is mild.  Abdomen: Patient reports no abdominal tenderness.   Physical Exam  Vitals reviewed. Constitutional: She appears well-developed. No distress.  Cardiovascular: Normal rate.  Respiratory: No respiratory distress.  GI: There is no tenderness.  Gravid, obese  Musculoskeletal: Normal range of motion.  Neurological: She is alert.  Skin: Skin is warm and dry.  Psychiatric: She has a normal mood and affect. Her behavior is normal.    Prenatal labs: ABO, Rh: --/--/O NEG (09/03 2149) Antibody: POS (09/03 2149) Rubella:  immune RPR: Non Reactive (08/29 1514)  HBsAg:   negative HIV:   negative GBS:     Assessment/Plan: G1P0000 [redacted]w[redacted]d Uncontrolled BG type 1 diabetes with non-reassure fetal surveillance, recommended for delivery by MFM. Remote from delivery I recommend cesarean section.The risks of cesarean section discussed with the patient included but were not limited to: bleeding which may require transfusion or reoperation; infection which may require antibiotics; injury to bowel, bladder, ureters or other surrounding organs; injury to the fetus; need for additional procedures including hysterectomy in the event of a life-threatening hemorrhage; placental abnormalities wth subsequent pregnancies, incisional problems, thromboembolic phenomenon and other postoperative/anesthesia complications. The patient concurred with the proposed plan, giving informed written consent for the procedure.   Patient has been NPO since 1300 she will remain NPO for procedure. Anesthesia and OR aware. Preoperative prophylactic antibiotics and SCDs ordered on call  to the OR.  To OR when ready. Will order appropriate insulin therapy and may need glucostabilizer    Emeterio Reeve 12/13/2017, 4:39 PM

## 2017-12-13 NOTE — MAU Note (Signed)
Urine in lab 

## 2017-12-13 NOTE — Anesthesia Procedure Notes (Addendum)
Spinal  Patient location during procedure: OR Start time: 12/13/2017 9:10 PM End time: 12/13/2017 9:20 PM Staffing Anesthesiologist: Janeece Riggers, MD Preanesthetic Checklist Completed: patient identified, site marked, surgical consent, pre-op evaluation, timeout performed, IV checked, risks and benefits discussed and monitors and equipment checked Spinal Block Patient position: sitting Prep: DuraPrep Patient monitoring: heart rate, cardiac monitor, continuous pulse ox and blood pressure Approach: midline Location: L3-4 Injection technique: single-shot Needle Needle type: Sprotte  Needle gauge: 24 G Needle length: 9 cm Assessment Sensory level: T4

## 2017-12-13 NOTE — MAU Note (Signed)
Pt sent from office for 2/8 BPP.  Type 1 diabetic

## 2017-12-13 NOTE — Progress Notes (Signed)
[name] [name] is a 22 y.o. G1P0000 at [redacted]w[redacted]d who presents to MAU today for BPP 2/10 in MFM today. Her pregnancy is complicated by Type I DM, obesity, Rh neg, and polyhydramnios.  Temp 98.5 F (36.9 C) (Oral)   LMP 04/11/2017   Patient Vitals for the past 24 hrs:  Temp Temp src  12/13/17 1554 98.5 F (36.9 C) Oral  BP 129/80 HR 90  CONSTITUTIONAL: Well-developed, well-nourished female in no acute distress.  MUSCULOSKELETAL: Normal range of motion.  CARDIOVASCULAR: Regular heart rate RESPIRATORY: Normal effort NEUROLOGICAL: Alert and oriented to person, place, and time.  SKIN: No pallor. PSYCH: Normal mood and affect. Normal behavior. Normal judgment and thought content. SVE: closed/thick EFM: 170 bpm, min variability, no accels, no decels Toco: 3-4  Results for orders placed or performed during the hospital encounter of 12/13/17 (from the past 24 hour(s))  Glucose, capillary     Status: Abnormal   Collection Time: 12/13/17  4:14 PM  Result Value Ref Range   Glucose-Capillary 400 (H) 70 - 99 mg/dL  Glucose, capillary     Status: Abnormal   Collection Time: 12/13/17  4:15 PM  Result Value Ref Range   Glucose-Capillary 402 (H) 70 - 99 mg/dL  CBC     Status: Abnormal   Collection Time: 12/13/17  5:23 PM  Result Value Ref Range   WBC 6.6 4.0 - 10.5 K/uL   RBC 4.10 3.87 - 5.11 MIL/uL   Hemoglobin 11.6 (L) 12.0 - 15.0 g/dL   HCT 34.6 (L) 36.0 - 46.0 %   MCV 84.4 78.0 - 100.0 fL   MCH 28.3 26.0 - 34.0 pg   MCHC 33.5 30.0 - 36.0 g/dL   RDW 13.0 11.5 - 15.5 %   Platelets 169 150 - 400 K/uL  Comprehensive metabolic panel     Status: Abnormal   Collection Time: 12/13/17  5:23 PM  Result Value Ref Range   Sodium 133 (L) 135 - 145 mmol/L   Potassium 4.0 3.5 - 5.1 mmol/L   Chloride 104 98 - 111 mmol/L   CO2 20 (L) 22 - 32 mmol/L   Glucose, Bld 354 (H) 70 - 99 mg/dL   BUN 15 6 - 20 mg/dL   Creatinine, Ser 0.65 0.44 - 1.00 mg/dL   Calcium 8.6 (L) 8.9 - 10.3 mg/dL   Total  Protein 5.9 (L) 6.5 - 8.1 g/dL   Albumin 2.5 (L) 3.5 - 5.0 g/dL   AST 12 (L) 15 - 41 U/L   ALT 10 0 - 44 U/L   Alkaline Phosphatase 87 38 - 126 U/L   Total Bilirubin 0.5 0.3 - 1.2 mg/dL   GFR calc non Af Amer >60 >60 mL/min   GFR calc Af Amer >60 >60 mL/min   Anion gap 9 5 - 15  Type and screen     Status: None (Preliminary result)   Collection Time: 12/13/17  5:23 PM  Result Value Ref Range   ABO/RH(D) O NEG    Antibody Screen POS    Sample Expiration 12/16/2017    Antibody Identification      PASSIVELY ACQUIRED ANTI-D Performed at [facility], [address] [place], [place] [address]    Unit Number [id number]    Blood Component Type RBC LR PHER2    Unit division 00    Status of Unit ALLOCATED    Transfusion Status OK TO TRANSFUSE    Crossmatch Result COMPATIBLE    Unit Number [id number]    Blood Component  Type RED CELLS,LR    Unit division 00    Status of Unit ALLOCATED    Transfusion Status OK TO TRANSFUSE    Crossmatch Result COMPATIBLE   Blood gas, arterial     Status: Abnormal   Collection Time: 12/13/17  5:35 PM  Result Value Ref Range   FIO2 0.21    Delivery systems ROOM AIR    pH, Arterial 7.442 7.350 - 7.450   pCO2 arterial 29.1 (L) 32.0 - 48.0 mmHg   pO2, Arterial 110 (H) 83.0 - 108.0 mmHg   Bicarbonate 19.6 (L) 20.0 - 28.0 mmol/L   Acid-base deficit 3.1 (H) 0.0 - 2.0 mmol/L   O2 Saturation 96.0 %   Collection site RIGHT RADIAL    Drawn by [id number]    Sample type ARTERIAL    Allens test (pass/fail) PASS PASS  Glucose, capillary     Status: Abnormal   Collection Time: 12/13/17  5:51 PM  Result Value Ref Range   Glucose-Capillary 280 (H) 70 - 99 mg/dL  Glucose, capillary     Status: Abnormal   Collection Time: 12/13/17  7:51 PM  Result Value Ref Range   Glucose-Capillary 151 (H) 70 - 99 mg/dL   Comment 1 Notify RN   Glucose, capillary     Status: Abnormal   Collection Time: 12/13/17  8:52 PM  Result Value Ref Range   Glucose-Capillary 136  (H) 70 - 99 mg/dL   MDM IV started, labs ordered. Dr Roselie Awkward notified of NST and BPP, at bedside. Plan for CS, pt ate around 1pm. Will start Insulin. Blood sugars improving with Insulin. Awaiting OR.  A: 33.6 weeks  NST non-reactive Hypergylcemia  P: Admit to OR Mngt per MD  Julianne Handler, CNM  12/13/2017 9:03 PM

## 2017-12-13 NOTE — Anesthesia Preprocedure Evaluation (Signed)
Anesthesia Evaluation  Patient identified by MRN, date of birth, ID band Patient awake    Reviewed: Allergy & Precautions, H&P , NPO status , Patient's Chart, lab work & pertinent test results, reviewed documented beta blocker date and time   Airway Mallampati: II  TM Distance: >3 FB Neck ROM: full    Dental no notable dental hx. (+) Teeth Intact   Pulmonary asthma ,    Pulmonary exam normal breath sounds clear to auscultation       Cardiovascular negative cardio ROS Normal cardiovascular exam Rhythm:regular Rate:Normal     Neuro/Psych  Headaches, negative psych ROS   GI/Hepatic negative GI ROS, Neg liver ROS,   Endo/Other  diabetes, Type 1Morbid obesity  Renal/GU negative Renal ROS  negative genitourinary   Musculoskeletal   Abdominal (+) + obese,   Peds  Hematology negative hematology ROS (+)   Anesthesia Other Findings   Reproductive/Obstetrics (+) Pregnancy                             Anesthesia Physical Anesthesia Plan  ASA: III and emergent  Anesthesia Plan: Spinal   Post-op Pain Management:    Induction:   PONV Risk Score and Plan: 2 and Treatment may vary due to age or medical condition and Ondansetron  Airway Management Planned: Nasal Cannula  Additional Equipment:   Intra-op Plan:   Post-operative Plan:   Informed Consent: I have reviewed the patients History and Physical, chart, labs and discussed the procedure including the risks, benefits and alternatives for the proposed anesthesia with the patient or authorized representative who has indicated his/her understanding and acceptance.     Plan Discussed with: CRNA, Surgeon and Anesthesiologist  Anesthesia Plan Comments:         Anesthesia Quick Evaluation

## 2017-12-13 NOTE — Op Note (Addendum)
[name] [name] PROCEDURE DATE: 12/13/2017  PREOPERATIVE DIAGNOSES: Intrauterine pregnancy at [redacted]w[redacted]d weeks gestation;                                                        Non reassuring fetal status with BPP 2/10                                                       Uncontrolled diabetes due to non compliance with polyhydramnios  POSTOPERATIVE DIAGNOSES: The same  PROCEDURE: Primary Low Transverse Cesarean Section  SURGEON:  Dr. Tania Ade ASSISTANT:  Dr. Gerri Lins, OB Fellow   ANESTHESIOLOGY TEAM: Anesthesiologist: Janeece Riggers, MD CRNA: Ignacia Bayley, CRNA  INDICATIONS: [name] [name] is a 22 y.o. G1P0101 at [redacted]w[redacted]d here for cesarean section secondary to the indications listed under preoperative diagnoses; please see preoperative note for further details.  The risks of cesarean section were discussed with the patient including but were not limited to: bleeding which may require transfusion or reoperation; infection which may require antibiotics; injury to bowel, bladder, ureters or other surrounding organs; injury to the fetus; need for additional procedures including hysterectomy in the event of a life-threatening hemorrhage; placental abnormalities wth subsequent pregnancies, incisional problems, thromboembolic phenomenon and other postoperative/anesthesia complications.   The patient concurred with the proposed plan, giving informed written consent for the procedure.    FINDINGS:  Viable female infant in breech presentation.  Apgars 9 and 10.  Clear amniotic fluid.  Intact placenta, three vessel cord.  Normal uterus, fallopian tubes and ovaries bilaterally. Posterior 2cm subserosal fibroid.   ANESTHESIA: Spinal INTRAVENOUS FLUIDS: 2000 ml   ESTIMATED BLOOD LOSS: 529 ml URINE OUTPUT:  150 ml SPECIMENS: Placenta sent to pathology COMPLICATIONS: None immediate  PROCEDURE IN DETAIL:  The patient preoperatively received intravenous antibiotics and had sequential compression devices  applied to her lower extremities.  She was then taken to the operating room where spinal anesthesia was administered and was found to be adequate. She was then placed in a dorsal supine position with a leftward tilt, and prepped and draped in a sterile manner.  A foley catheter was placed into her bladder and attached to constant gravity.  After an adequate timeout was performed, a Joel-Cohen skin incision was made with scalpel and carried through to the underlying layer of fascia. The fascia was incised in the midline, and this incision was extended bluntly.  The rectus muscles were separated in the midline and the peritoneum was entered bluntly.  Attention was turned to the lower uterine segment where a low transverse hysterotomy was made with a scalpel and extended bilaterally bluntly. The infant was successfully delivered breech, the cord was clamped and cut after one minute, and the infant was handed over to the awaiting neonatology team. Uterine massage was then administered, and the placenta delivered intact with a three-vessel cord. The uterus was then cleared of clots and debris . The uterus was externalized, and the hysterotomy was closed with 0 Monocryl in a running locked fashion, and an imbricating layer was also placed with 0 Monocryl. The uterus was then returned to the abdominal cavity.The pelvis was cleared of all clot  and debris. Irrigation was performed x 3.  Hemostasis was confirmed on all surfaces.   The peritoneum and rectus muscles were closed with a 0 chromic in interrupted sutures. The fascia was then closed using 0 Vicryl in a running fashion.  The subcutaneous layer was irrigated, and the skin was closed with a 4-0 Vicryl subcuticular stitch with a Lanny Hurst. The patient tolerated the procedure well. Sponge, instrument and needle counts were correct x 3.  She was taken to the recovery room in stable condition.   Lambert Mody. Juleen China, DO OB/GYN Fellow  Attestation of Attending Supervision of  Advanced Practitioner (CNM/NP/PA): Evaluation and management procedures were performed by the Advanced Practitioner under my supervision and collaboration. I have reviewed the Advanced Practitioner's note and chart, and I agree with the management and plan.  Jacelyn Grip MD Attending Physician for the Center for [facility] 12/14/2017 6:47 AM

## 2017-12-14 LAB — CBC
HCT: 35 % — ABNORMAL LOW (ref 36.0–46.0)
HCT: 35.3 % — ABNORMAL LOW (ref 36.0–46.0)
Hemoglobin: 11.9 g/dL — ABNORMAL LOW (ref 12.0–15.0)
Hemoglobin: 12 g/dL (ref 12.0–15.0)
MCH: 28.5 pg (ref 26.0–34.0)
MCH: 28.5 pg (ref 26.0–34.0)
MCHC: 34 g/dL (ref 30.0–36.0)
MCHC: 34 g/dL (ref 30.0–36.0)
MCV: 83.7 fL (ref 78.0–100.0)
MCV: 83.8 fL (ref 78.0–100.0)
PLATELETS: 192 10*3/uL (ref 150–400)
PLATELETS: 199 10*3/uL (ref 150–400)
RBC: 4.18 MIL/uL (ref 3.87–5.11)
RBC: 4.21 MIL/uL (ref 3.87–5.11)
RDW: 13.1 % (ref 11.5–15.5)
RDW: 13 % (ref 11.5–15.5)
WBC: 13.2 10*3/uL — AB (ref 4.0–10.5)
WBC: 13.4 10*3/uL — AB (ref 4.0–10.5)

## 2017-12-14 LAB — RPR: RPR: NONREACTIVE

## 2017-12-14 LAB — GLUCOSE, CAPILLARY
GLUCOSE-CAPILLARY: 67 mg/dL — AB (ref 70–99)
GLUCOSE-CAPILLARY: 207 mg/dL — AB (ref 70–99)
Glucose-Capillary: 125 mg/dL — ABNORMAL HIGH (ref 70–99)
Glucose-Capillary: 148 mg/dL — ABNORMAL HIGH (ref 70–99)
Glucose-Capillary: 259 mg/dL — ABNORMAL HIGH (ref 70–99)
Glucose-Capillary: 260 mg/dL — ABNORMAL HIGH (ref 70–99)
Glucose-Capillary: 87 mg/dL (ref 70–99)

## 2017-12-14 LAB — CREATININE, SERUM
CREATININE: 0.55 mg/dL (ref 0.44–1.00)
GFR calc Af Amer: >60 mL/min (ref >60)

## 2017-12-14 MED ORDER — ENOXAPARIN SODIUM 60 MG/0.6ML ~~LOC~~ SOLN
0.5000 mg/kg | SUBCUTANEOUS | Status: DC
  Administered 2017-12-14 – 2017-12-15 (×2): 45 mg via SUBCUTANEOUS
  Filled 2017-12-14 (×4): qty 0.6

## 2017-12-14 MED ORDER — WITCH HAZEL-GLYCERIN EX PADS: 1.0000 "application " | MEDICATED_PAD | CUTANEOUS | Status: DC | PRN

## 2017-12-14 MED ORDER — COCONUT OIL OIL
1.0000 "application " | TOPICAL_OIL | Status: DC | PRN
  Administered 2017-12-14: 1 via TOPICAL
  Filled 2017-12-14: qty 120

## 2017-12-14 MED ORDER — INSULIN ASPART 100 UNIT/ML ~~LOC~~ SOLN: 0.0000 [IU] | Freq: Three times a day (TID) | SUBCUTANEOUS | Status: DC

## 2017-12-14 MED ORDER — LACTATED RINGERS IV SOLN
INTRAVENOUS | Status: DC
  Administered 2017-12-14: 02:00:00 via INTRAVENOUS

## 2017-12-14 MED ORDER — INSULIN GLARGINE 100 UNIT/ML ~~LOC~~ SOLN: 15.0000 [IU] | Freq: Every day | SUBCUTANEOUS | Status: DC

## 2017-12-14 MED ORDER — ZOLPIDEM TARTRATE 5 MG PO TABS: 5.0000 mg | ORAL_TABLET | Freq: Every evening | ORAL | Status: DC | PRN

## 2017-12-14 MED ORDER — SENNOSIDES-DOCUSATE SODIUM 8.6-50 MG PO TABS
2.0000 | ORAL_TABLET | ORAL | Status: DC
  Administered 2017-12-14 – 2017-12-15 (×3): 2 via ORAL
  Filled 2017-12-14 (×3): qty 2

## 2017-12-14 MED ORDER — DIBUCAINE 1 % RE OINT: 1.0000 "application " | TOPICAL_OINTMENT | RECTAL | Status: DC | PRN

## 2017-12-14 MED ORDER — KETOROLAC TROMETHAMINE 30 MG/ML IJ SOLN: 30.0000 mg | Freq: Four times a day (QID) | INTRAMUSCULAR | Status: AC | PRN

## 2017-12-14 MED ORDER — SIMETHICONE 80 MG PO CHEW
80.0000 mg | CHEWABLE_TABLET | Freq: Three times a day (TID) | ORAL | Status: DC
  Administered 2017-12-14 – 2017-12-16 (×5): 80 mg via ORAL
  Filled 2017-12-14 (×6): qty 1

## 2017-12-14 MED ORDER — RHO D IMMUNE GLOBULIN 1500 UNIT/2ML IJ SOSY
300.0000 ug | PREFILLED_SYRINGE | Freq: Once | INTRAMUSCULAR | Status: AC
  Administered 2017-12-14: 300 ug via INTRAVENOUS
  Filled 2017-12-14: qty 2

## 2017-12-14 MED ORDER — ACETAMINOPHEN 325 MG PO TABS: 650.0000 mg | ORAL_TABLET | ORAL | Status: DC | PRN

## 2017-12-14 MED ORDER — IBUPROFEN 600 MG PO TABS
600.0000 mg | ORAL_TABLET | Freq: Four times a day (QID) | ORAL | Status: DC
  Administered 2017-12-14 – 2017-12-16 (×8): 600 mg via ORAL
  Filled 2017-12-14 (×9): qty 1

## 2017-12-14 MED ORDER — MENTHOL 3 MG MT LOZG: 1.0000 | LOZENGE | OROMUCOSAL | Status: DC | PRN

## 2017-12-14 MED ORDER — KETOROLAC TROMETHAMINE 30 MG/ML IJ SOLN
30.0000 mg | Freq: Four times a day (QID) | INTRAMUSCULAR | Status: AC | PRN
  Administered 2017-12-14: 30 mg via INTRAMUSCULAR

## 2017-12-14 MED ORDER — SIMETHICONE 80 MG PO CHEW
80.0000 mg | CHEWABLE_TABLET | ORAL | Status: DC | PRN
  Filled 2017-12-14: qty 1

## 2017-12-14 MED ORDER — KETOROLAC TROMETHAMINE 30 MG/ML IJ SOLN
INTRAMUSCULAR | Status: AC
  Filled 2017-12-14: qty 1

## 2017-12-14 MED ORDER — OXYCODONE HCL 5 MG PO TABS
5.0000 mg | ORAL_TABLET | ORAL | Status: DC | PRN
  Administered 2017-12-15: 5 mg via ORAL
  Filled 2017-12-14: qty 1

## 2017-12-14 MED ORDER — INSULIN ASPART 100 UNIT/ML ~~LOC~~ SOLN
0.0000 [IU] | Freq: Three times a day (TID) | SUBCUTANEOUS | Status: DC
  Administered 2017-12-14 (×2): 3 [IU] via SUBCUTANEOUS
  Administered 2017-12-15: 1 [IU] via SUBCUTANEOUS
  Administered 2017-12-15 – 2017-12-16 (×2): 3 [IU] via SUBCUTANEOUS

## 2017-12-14 MED ORDER — SIMETHICONE 80 MG PO CHEW
80.0000 mg | CHEWABLE_TABLET | ORAL | Status: DC
  Administered 2017-12-14 – 2017-12-15 (×3): 80 mg via ORAL
  Filled 2017-12-14 (×3): qty 1

## 2017-12-14 MED ORDER — DIPHENHYDRAMINE HCL 25 MG PO CAPS: 25.0000 mg | ORAL_CAPSULE | Freq: Four times a day (QID) | ORAL | Status: DC | PRN

## 2017-12-14 MED ORDER — PRENATAL MULTIVITAMIN CH
1.0000 | ORAL_TABLET | Freq: Every day | ORAL | Status: DC
  Administered 2017-12-14 – 2017-12-16 (×3): 1 via ORAL
  Filled 2017-12-14 (×3): qty 1

## 2017-12-14 MED ORDER — OXYCODONE HCL 5 MG PO TABS
10.0000 mg | ORAL_TABLET | ORAL | Status: DC | PRN
  Administered 2017-12-14 – 2017-12-16 (×4): 10 mg via ORAL
  Filled 2017-12-14 (×4): qty 2

## 2017-12-14 MED ORDER — INSULIN GLARGINE 100 UNIT/ML ~~LOC~~ SOLN
15.0000 [IU] | Freq: Every day | SUBCUTANEOUS | Status: DC
  Administered 2017-12-15 (×2): 15 [IU] via SUBCUTANEOUS
  Filled 2017-12-14 (×3): qty 0.15

## 2017-12-14 MED ORDER — INSULIN ASPART 100 UNIT/ML ~~LOC~~ SOLN
0.0000 [IU] | Freq: Every day | SUBCUTANEOUS | Status: DC
  Administered 2017-12-15 (×2): 3 [IU] via SUBCUTANEOUS

## 2017-12-14 MED ORDER — OXYTOCIN 40 UNITS IN LACTATED RINGERS INFUSION - SIMPLE MED: 2.5000 [IU]/h | INTRAVENOUS | Status: AC

## 2017-12-14 MED ORDER — INSULIN GLARGINE 100 UNIT/ML ~~LOC~~ SOLN
20.0000 [IU] | Freq: Every day | SUBCUTANEOUS | Status: DC
  Filled 2017-12-14: qty 0.2

## 2017-12-14 NOTE — Progress Notes (Signed)
Subjective: Postpartum Day 1: Cesarean Delivery Patient reports no complaints doing well.    Objective: Vital signs in last 24 hours: Temp:  [97.6 F (36.4 C)-98.8 F (37.1 C)] 97.6 F (36.4 C) (09/28 0829) Pulse Rate:  [64-82] 75 (09/28 0829) Resp:  [14-22] 17 (09/28 0829) BP: (92-127)/(41-88) 92/65 (09/28 0829) SpO2:  [98 %-100 %] 99 % (09/28 0829)  Physical Exam:  General: alert, cooperative and no distress Lochia: appropriate Uterine Fundus: firm Incision: dressing dry DVT Evaluation: No evidence of DVT seen on physical exam.  Recent Labs    12/13/17 1723 12/14/17 0612  HGB 11.6* 11.9*  12.0  HCT 34.6* 35.0*  35.3*    Assessment/Plan: Status post Cesarean section. Doing well postoperatively.  Continue current care, titrating her insulin.  [name] [name] 12/14/2017, 9:00 AM

## 2017-12-14 NOTE — Anesthesia Postprocedure Evaluation (Signed)
Anesthesia Post Note  Patient: [name] [name]  Procedure(s) Performed: CESAREAN SECTION (N/A )     Patient location during evaluation: PACU Anesthesia Type: Spinal Level of consciousness: oriented and awake and alert Pain management: pain level controlled Vital Signs Assessment: post-procedure vital signs reviewed and stable Respiratory status: spontaneous breathing, respiratory function stable and patient connected to nasal cannula oxygen Cardiovascular status: blood pressure returned to baseline and stable Postop Assessment: no headache, no backache and no apparent nausea or vomiting Anesthetic complications: no    Last Vitals:  Vitals:   12/14/17 0015 12/14/17 0100  BP: 120/75 127/83  Pulse: 69 66  Resp: (!) 22 19  Temp:  36.9 C  SpO2: 98% 99%    Last Pain:  Vitals:   12/14/17 0100  TempSrc: Oral  PainSc: 0-No pain   Pain Goal:                 Dominico Rod

## 2017-12-14 NOTE — Progress Notes (Signed)
Set pt up with DEBP. Education on how to use, frequency, and storage of breast milk.

## 2017-12-14 NOTE — Progress Notes (Signed)
Inpatient Diabetes Program Recommendations  AACE/ADA: New Consensus Statement on Inpatient Glycemic Control (2015)  Target Ranges:  Prepandial:   less than 140 mg/dL      Peak postprandial:   less than 180 mg/dL (1-2 hours)      Critically ill patients:  140 - 180 mg/dL   Lab Results  Component Value Date   GLUCAP 148 (H) 12/14/2017   HGBA1C 8.3 (H) 10/08/2017    Review of Glycemic ControlResults for Brester, Anwar (MRN [id number]) as of 12/14/2017 12:38  Ref. Range 12/13/2017 20:52 12/14/2017 05:30 12/14/2017 09:37 12/14/2017 11:56  Glucose-Capillary Latest Ref Range: 70 - 99 mg/dL 136 (H) 87 67 (L) 148 (H)    Diabetes history: Type 1 DM-Diagnosed at age 40 Outpatient Diabetes medications:  Lantus 40 units q HS, Novolog 2-10 units four times a day Current orders for Inpatient glycemic control:  Novolog 0-16 units tid with meals and HS, Lantus 20 units daily  Inpatient Diabetes Program Recommendations:    Note patient delivered 12/13/17. Please d/c current Novolog correction scale and change to Glycemic control order set-sensitive tid with meals and HS.   Also consider reducing Lantus to 15 units daily.   Thanks,  Adah Perl, RN, BC-ADM Inpatient Diabetes Coordinator Pager [phone] (8a-5p)

## 2017-12-14 NOTE — Anesthesia Postprocedure Evaluation (Signed)
Anesthesia Post Note  Patient: [name] [name]  Procedure(s) Performed: CESAREAN SECTION (N/A )     Patient location during evaluation: [facility] Unit Anesthesia Type: Spinal Level of consciousness: oriented and awake and alert Pain management: pain level controlled Vital Signs Assessment: post-procedure vital signs reviewed and stable Respiratory status: spontaneous breathing, respiratory function stable and patient connected to nasal cannula oxygen Cardiovascular status: blood pressure returned to baseline and stable Postop Assessment: no headache, no backache, no apparent nausea or vomiting and patient able to bend at knees Anesthetic complications: no    Last Vitals:  Vitals:   12/14/17 0500 12/14/17 0829  BP: 114/72 92/65  Pulse: 77 75  Resp: 14 17  Temp: 36.9 C 36.4 C  SpO2: 98% 99%    Last Pain:  Vitals:   12/14/17 0829  TempSrc: Oral  PainSc:    Pain Goal:                 Rayvon Char

## 2017-12-14 NOTE — Lactation Note (Signed)
This note was copied from a baby's chart. Lactation Consultation Note  Patient Name: [name] [name] [id number] Date: 12/14/2017 Reason for consult: Initial assessment;1st time breastfeeding;Primapara;Preterm <34wks;NICU baby;Infant weight loss  Visited with mom of 15 hours old preterm NICU baby, she's a P1 but attended BF classes at the [facility] office in the [facility]. Mom doesn't have a pump at home, [facility] gave her some options on getting one, she's aware of the [facility] in case she gets discharged during the weekend. She's also aware that [facility] comes here to the hospital and will coordinate with her RN on Monday (if mom is still here) to have them come to her room.  DEBP has already been set up in the room, mo has already pumped twice today and got zero drops. NT also assisted mom with hand expression but unable to get any colostrum yet. When [facility] assisted with hand expression noticed that mom has edema, and her nipples are also flat. Mom reported (+) breast changes during the pregnancy though but her tissue is non-compressible, it feels almost like rubber, unable to compress at all; not just the nipple/areola complex but also the breast tissue surrounding it.  [place] set up mom with breast shells, shells instructions, cleaning and storage were reviewed. [place] asked her RN Juliann Pulse to bring some coconut oil for mom, since she still doesn't have any colostrum yet. Instructed mom to use it prior pumping.  Feeding plan:  1. Mom will pump every 3 hours during the day time and at least once at night; a minimum of 6 pumping sessions in 24 hours.  2. Mom will use coconut oil prior pumping 3. She'll start wearing her breast shells as soon as her family brings a nursing bra or top to the hospital.  BF brochure, BF resources, pumping log and milk storage guidelines for NICU babies were reviewed, mom is aware of [facility] services and will call PRN.  Maternal Data Formula Feeding for Exclusion: No Has patient been  taught Hand Expression?: Yes Does the patient have breastfeeding experience prior to this delivery?: No  Feeding   Interventions Interventions: Breast feeding basics reviewed;Hand express;Breast massage;Breast compression;Reverse pressure;Shells;DEBP  Lactation Tools Discussed/Used Tools: Shells;Pump Shell Type: Inverted Breast pump type: Double-Electric Breast Pump WIC Program: Yes Pump Review: Setup, frequency, and cleaning;Milk Storage Initiated by:: RN Date initiated:: 12/14/17   Consult Status Consult Status: Follow-up Date: 12/15/17 Follow-up type: In-patient    Kamori Kitchens Francene Boyers 12/14/2017, 12:44 PM

## 2017-12-14 NOTE — Addendum Note (Signed)
Addendum  created 12/14/17 0850 by Rayvon Char, CRNA   Sign clinical note

## 2017-12-15 LAB — RH IG WORKUP (INCLUDES ABO/RH)
ABO/RH(D): O NEG
Fetal Screen: NEGATIVE
Gestational Age(Wks): 33.6
Unit division: 0

## 2017-12-15 LAB — GLUCOSE, CAPILLARY
GLUCOSE-CAPILLARY: 111 mg/dL — AB (ref 70–99)
GLUCOSE-CAPILLARY: 124 mg/dL — AB (ref 70–99)
Glucose-Capillary: 213 mg/dL — ABNORMAL HIGH (ref 70–99)
Glucose-Capillary: 290 mg/dL — ABNORMAL HIGH (ref 70–99)

## 2017-12-15 NOTE — Progress Notes (Signed)
Subjective: Postpartum Day 2: Cesarean Delivery Patient reports incisional pain, tolerating PO, + flatus and no problems voiding.   Significant changes to insulin yesterday. CBGs have been very high  Objective: Vital signs in last 24 hours: Temp:  [97.6 F (36.4 C)-98.8 F (37.1 C)] 98.6 F (37 C) (09/29 0527) Pulse Rate:  [71-100] 95 (09/29 0527) Resp:  [16-18] 16 (09/29 0527) BP: (88-124)/(39-75) 99/55 (09/29 0527) SpO2:  [97 %-100 %] 99 % (09/28 2356)  Physical Exam:  General: alert, cooperative and appears stated age 22: appropriate Uterine Fundus: firm Incision: no significant drainage DVT Evaluation: No evidence of DVT seen on physical exam.  Recent Labs    12/13/17 1723 12/14/17 0612  HGB 11.6* 11.9*  12.0  HCT 34.6* 35.0*  35.3*   Lab Results  Component Value Date   GLUCAP 260 (H) 12/14/2017   GLUCAP 207 (H) 12/14/2017   GLUCAP 259 (H) 12/14/2017     Assessment/Plan: Status post Cesarean section. Doing well postoperatively.  Continue current care. Attempt better glycemic control prior to discharge Discharge plan for tomorrow   [name] [name] 12/15/2017, 7:49 AM

## 2017-12-16 ENCOUNTER — Encounter (HOSPITAL_COMMUNITY): Payer: Self-pay | Admitting: *Deleted

## 2017-12-16 LAB — BPAM RBC
BLOOD PRODUCT EXPIRATION DATE: 201911012359
BLOOD PRODUCT EXPIRATION DATE: 201911032359
UNIT TYPE AND RH: 9500
UNIT TYPE AND RH: 9500

## 2017-12-16 LAB — TYPE AND SCREEN
ABO/RH(D): O NEG
Antibody Screen: POSITIVE
Unit division: 0
Unit division: 0

## 2017-12-16 LAB — GLUCOSE, CAPILLARY
GLUCOSE-CAPILLARY: 83 mg/dL (ref 70–99)
Glucose-Capillary: 214 mg/dL — ABNORMAL HIGH (ref 70–99)
Glucose-Capillary: 232 mg/dL — ABNORMAL HIGH (ref 70–99)

## 2017-12-16 MED ORDER — INSULIN ASPART 100 UNIT/ML ~~LOC~~ SOLN: 6.0000 [IU] | Freq: Every day | SUBCUTANEOUS | 11 refills | Status: DC

## 2017-12-16 MED ORDER — OXYCODONE HCL 5 MG PO TABS: 5.0000 mg | ORAL_TABLET | ORAL | 0 refills | Status: DC | PRN

## 2017-12-16 MED ORDER — IBUPROFEN 600 MG PO TABS: 600.0000 mg | ORAL_TABLET | Freq: Four times a day (QID) | ORAL | 0 refills | Status: DC

## 2017-12-16 MED ORDER — INSULIN ASPART 100 UNIT/ML ~~LOC~~ SOLN: 6.0000 [IU] | Freq: Three times a day (TID) | SUBCUTANEOUS | 11 refills | Status: DC

## 2017-12-16 MED ORDER — INSULIN GLARGINE 100 UNIT/ML ~~LOC~~ SOLN: 20.0000 [IU] | Freq: Every day | SUBCUTANEOUS | 11 refills | Status: DC

## 2017-12-16 NOTE — Lactation Note (Signed)
This note was copied from a baby's chart. Lactation Consultation Note; Mom reports she pumped 4 times yesterday but did not obtain any milk. Was able to get a few drops with hand expression. Encouragement given.  Suggested  8 pumpings/24 hours to promote a good milk supply. I faxed a referral to [facility] and left them a note for pump for home. No questions at present. Reviewed our phone number to call with questions/concerns.  Patient Name: [name] [name] Date: 12/16/2017 Reason for consult: Follow-up assessment;Preterm <34wks   Maternal Data Formula Feeding for Exclusion: No Has patient been taught Hand Expression?: Yes Does the patient have breastfeeding experience prior to this delivery?: No  Feeding Feeding Type: Donor Breast Milk  LATCH Score                   Interventions    Lactation Tools Discussed/Used WIC Program: Yes   Consult Status Consult Status: Complete    Truddie Crumble 12/16/2017, 8:31 AM

## 2017-12-16 NOTE — Discharge Summary (Signed)
Physician Discharge Summary  Patient ID: [name] [name] MRN: [id number] DOB/AGE: [birth year] 22 y.o.  Admit date: 12/13/2017 Discharge date: 12/16/2017  Admission Diagnoses: Non reassuring fetal status Type 1 diabetes Discharge Diagnoses:  Principal Problem:   DM (diabetes mellitus), type 1 ([place]) Active Problems:   Supervision of high risk pregnancy, antepartum   Rh negative state in antepartum period   Severe obesity (BMI 35.0-39.9) with comorbidity ([place])   Obesity in pregnancy   Polyhydramnios affecting pregnancy   Diabetes mellitus in pregnancy   Fibroid   History of C-section   Cesarean delivery delivered   Discharged Condition: good  Hospital Course: BPP 2/10, breech underwent C section Post op course unremarkable  Consults: None  Significant Diagnostic Studies: labs:   Results for orders placed or performed during the hospital encounter of 12/13/17 (from the past 48 hour(s))  Glucose, capillary     Status: Abnormal   Collection Time: 12/14/17 11:56 AM  Result Value Ref Range   Glucose-Capillary 148 (H) 70 - 99 mg/dL  Glucose, capillary     Status: Abnormal   Collection Time: 12/14/17  3:31 PM  Result Value Ref Range   Glucose-Capillary 125 (H) 70 - 99 mg/dL  Glucose, capillary     Status: Abnormal   Collection Time: 12/14/17  6:05 PM  Result Value Ref Range   Glucose-Capillary 259 (H) 70 - 99 mg/dL  Glucose, capillary     Status: Abnormal   Collection Time: 12/14/17  9:07 PM  Result Value Ref Range   Glucose-Capillary 207 (H) 70 - 99 mg/dL   Comment 1 Notify RN   Glucose, capillary     Status: Abnormal   Collection Time: 12/14/17 11:57 PM  Result Value Ref Range   Glucose-Capillary 260 (H) 70 - 99 mg/dL   Comment 1 Notify RN   Glucose, capillary     Status: Abnormal   Collection Time: 12/15/17  8:18 AM  Result Value Ref Range   Glucose-Capillary 111 (H) 70 - 99 mg/dL  Glucose, capillary     Status: Abnormal   Collection Time: 12/15/17  2:24 PM  Result  Value Ref Range   Glucose-Capillary 124 (H) 70 - 99 mg/dL  Glucose, capillary     Status: Abnormal   Collection Time: 12/15/17  6:23 PM  Result Value Ref Range   Glucose-Capillary 213 (H) 70 - 99 mg/dL  Glucose, capillary     Status: Abnormal   Collection Time: 12/15/17  9:37 PM  Result Value Ref Range   Glucose-Capillary 290 (H) 70 - 99 mg/dL  Glucose, capillary     Status: None   Collection Time: 12/16/17  8:28 AM  Result Value Ref Range   Glucose-Capillary 83 70 - 99 mg/dL   CBC Latest Ref Rng & Units 12/14/2017 12/14/2017 12/13/2017  WBC 4.0 - 10.5 K/uL 13.2(H) 13.4(H) 6.6  Hemoglobin 12.0 - 15.0 g/dL 11.9(L) 12.0 11.6(L)  Hematocrit 36.0 - 46.0 % 35.0(L) 35.3(L) 34.6(L)  Platelets 150 - 400 K/uL 199 192 169     Treatments: surgery: primary C section  Discharge Exam: Blood pressure 130/72, pulse 91, temperature 98.3 F (36.8 C), temperature source Oral, resp. rate 18, last menstrual period 04/11/2017, SpO2 100 %, unknown if currently breastfeeding. General appearance: alert, cooperative and no distress GI: soft, non-tender; bowel sounds normal; no masses,  no organomegaly Incision/Wound:clean dry intact  Disposition: Discharge disposition: 01-Home or Self Care       Discharge Instructions    Call MD for:  persistant nausea and  vomiting   Complete by:  As directed    Call MD for:  severe uncontrolled pain   Complete by:  As directed    Call MD for:  temperature >100.4   Complete by:  As directed    Diet - low sodium heart healthy   Complete by:  As directed    Discharge wound care:   Complete by:  As directed    Remove dressing in 1 week   Driving Restrictions   Complete by:  As directed    No driving for 1 week   Increase activity slowly   Complete by:  As directed    Lifting restrictions   Complete by:  As directed    Do not lift more than 10 pounds for 2 weeks   Sexual Activity Restrictions   Complete by:  As directed    No sex for 6 weeks      Allergies as of 12/16/2017      Reactions   Banana Itching, Swelling   Throat swelling, difficulty breathing      Medication List    TAKE these medications   ACCU-CHEK FASTCLIX LANCETS Misc 1 Units by Percutaneous route 4 (four) times daily.   ACCU-CHEK NANO SMARTVIEW w/Device Kit 1 kit by Subdermal route as directed. Check blood sugars for fasting, and two hours after breakfast, lunch and dinner (4 checks daily)   acetaminophen 500 MG tablet Commonly known as:  TYLENOL Take 1,000 mg by mouth every 6 (six) hours as needed.   cephALEXin 500 MG capsule Commonly known as:  KEFLEX Take 1 capsule (500 mg total) by mouth daily.   glucose blood test strip Use as instructed to check blood sugars   ibuprofen 600 MG tablet Commonly known as:  ADVIL,MOTRIN Take 1 tablet (600 mg total) by mouth every 6 (six) hours.   insulin glargine 100 UNIT/ML injection Commonly known as:  LANTUS Inject 0.2 mLs (20 Units total) into the skin at bedtime. What changed:  how much to take   NOVOLOG FLEXPEN 100 UNIT/ML FlexPen Generic drug:  insulin aspart Inject 2-10 Units into the skin 4 (four) times daily as needed for high blood sugar. What changed:  Another medication with the same name was added. Make sure you understand how and when to take each.   insulin aspart 100 UNIT/ML injection Commonly known as:  novoLOG Inject 6 Units into the skin 3 (three) times daily with meals. What changed:  You were already taking a medication with the same name, and this prescription was added. Make sure you understand how and when to take each.   insulin aspart 100 UNIT/ML injection Commonly known as:  novoLOG Inject 6 Units into the skin at bedtime. What changed:  You were already taking a medication with the same name, and this prescription was added. Make sure you understand how and when to take each.   oxyCODONE 5 MG immediate release tablet Commonly known as:  Oxy IR/ROXICODONE Take 1 tablet (5 mg  total) by mouth every 4 (four) hours as needed (pain scale 4-7).   prenatal multivitamin Tabs tablet Take 1 tablet by mouth daily at 12 noon.            Discharge Care Instructions  (From admission, onward)         Start     Ordered   12/16/17 0000  Discharge wound care:    Comments:  Remove dressing in 1 week   12/16/17 0946  Follow-up [facility] up in 2 week(s).   Why:  post op check Contact information: [place] [address] [phone]          Signed: Florian Buff 12/16/2017, 9:46 AM

## 2017-12-16 NOTE — Progress Notes (Signed)
Discharge instructions given to patient. She acknowledges and understands. No questions at this time.

## 2017-12-16 NOTE — Discharge Instructions (Signed)

## 2017-12-17 ENCOUNTER — Encounter (HOSPITAL_COMMUNITY): Payer: Self-pay | Admitting: Obstetrics & Gynecology

## 2017-12-19 ENCOUNTER — Inpatient Hospital Stay (HOSPITAL_COMMUNITY)
Admission: AD | Admit: 2017-12-19 | Discharge: 2017-12-19 | Disposition: A | Discharge status: Home / Self Care | Payer: Medicaid Other | Source: Ambulatory Visit | Attending: Family Medicine | Admitting: Family Medicine

## 2017-12-19 ENCOUNTER — Ambulatory Visit (HOSPITAL_COMMUNITY): Payer: Self-pay

## 2017-12-19 ENCOUNTER — Other Ambulatory Visit: Payer: Self-pay

## 2017-12-19 ENCOUNTER — Encounter (HOSPITAL_COMMUNITY): Payer: Self-pay | Admitting: *Deleted

## 2017-12-19 DIAGNOSIS — Z79899 Other long term (current) drug therapy: Secondary | ICD-10-CM | POA: Insufficient documentation

## 2017-12-19 DIAGNOSIS — R300 Dysuria: Secondary | ICD-10-CM | POA: Insufficient documentation

## 2017-12-19 DIAGNOSIS — R1032 Left lower quadrant pain: Secondary | ICD-10-CM | POA: Diagnosis not present

## 2017-12-19 DIAGNOSIS — Z791 Long term (current) use of non-steroidal anti-inflammatories (NSAID): Secondary | ICD-10-CM | POA: Insufficient documentation

## 2017-12-19 DIAGNOSIS — R1031 Right lower quadrant pain: Secondary | ICD-10-CM | POA: Diagnosis not present

## 2017-12-19 DIAGNOSIS — Z794 Long term (current) use of insulin: Secondary | ICD-10-CM | POA: Diagnosis not present

## 2017-12-19 DIAGNOSIS — Z98891 History of uterine scar from previous surgery: Secondary | ICD-10-CM

## 2017-12-19 DIAGNOSIS — G8918 Other acute postprocedural pain: Secondary | ICD-10-CM | POA: Diagnosis not present

## 2017-12-19 DIAGNOSIS — R109 Unspecified abdominal pain: Secondary | ICD-10-CM

## 2017-12-19 DIAGNOSIS — Z833 Family history of diabetes mellitus: Secondary | ICD-10-CM | POA: Diagnosis not present

## 2017-12-19 DIAGNOSIS — R030 Elevated blood-pressure reading, without diagnosis of hypertension: Secondary | ICD-10-CM | POA: Insufficient documentation

## 2017-12-19 DIAGNOSIS — Z9889 Other specified postprocedural states: Secondary | ICD-10-CM | POA: Insufficient documentation

## 2017-12-19 DIAGNOSIS — E1065 Type 1 diabetes mellitus with hyperglycemia: Secondary | ICD-10-CM | POA: Insufficient documentation

## 2017-12-19 LAB — CBC WITH DIFFERENTIAL/PLATELET
BASOS ABS: 0 10*3/uL (ref 0.0–0.1)
BASOS PCT: 0 %
Eosinophils Absolute: 0.1 10*3/uL (ref 0.0–0.7)
Eosinophils Relative: 1 %
HCT: 30.9 % — ABNORMAL LOW (ref 36.0–46.0)
HEMOGLOBIN: 10.5 g/dL — AB (ref 12.0–15.0)
Lymphocytes Relative: 44 %
Lymphs Abs: 2.9 10*3/uL (ref 0.7–4.0)
MCH: 29.2 pg (ref 26.0–34.0)
MCHC: 34 g/dL (ref 30.0–36.0)
MCV: 85.8 fL (ref 78.0–100.0)
MONO ABS: 0.3 10*3/uL (ref 0.1–1.0)
Monocytes Relative: 4 %
NEUTROS ABS: 3.3 10*3/uL (ref 1.7–7.7)
NEUTROS PCT: 51 %
Platelets: 261 10*3/uL (ref 150–400)
RBC: 3.6 MIL/uL — AB (ref 3.87–5.11)
RDW: 13.3 % (ref 11.5–15.5)
WBC: 6.6 10*3/uL (ref 4.0–10.5)

## 2017-12-19 LAB — URINALYSIS, ROUTINE W REFLEX MICROSCOPIC
Bilirubin Urine: NEGATIVE
Glucose, UA: 50 mg/dL — AB
Hgb urine dipstick: NEGATIVE
KETONES UR: NEGATIVE mg/dL
LEUKOCYTES UA: NEGATIVE
NITRITE: NEGATIVE
PH: 7 (ref 5.0–8.0)
Protein, ur: NEGATIVE mg/dL
Specific Gravity, Urine: 1.014 (ref 1.005–1.030)

## 2017-12-19 LAB — COMPREHENSIVE METABOLIC PANEL
ALBUMIN: 2.5 g/dL — AB (ref 3.5–5.0)
ALT: 22 U/L (ref 0–44)
AST: 21 U/L (ref 15–41)
Alkaline Phosphatase: 57 U/L (ref 38–126)
Anion gap: 6 (ref 5–15)
BUN: 13 mg/dL (ref 6–20)
CHLORIDE: 103 mmol/L (ref 98–111)
CO2: 25 mmol/L (ref 22–32)
Calcium: 7.8 mg/dL — ABNORMAL LOW (ref 8.9–10.3)
Creatinine, Ser: 0.48 mg/dL (ref 0.44–1.00)
GFR calc Af Amer: >60 mL/min (ref >60)
GFR calc non Af Amer: >60 mL/min (ref >60)
GLUCOSE: 108 mg/dL — AB (ref 70–99)
POTASSIUM: 3.6 mmol/L (ref 3.5–5.1)
Sodium: 134 mmol/L — ABNORMAL LOW (ref 135–145)
Total Bilirubin: 0.3 mg/dL (ref 0.3–1.2)
Total Protein: 6.2 g/dL — ABNORMAL LOW (ref 6.5–8.1)

## 2017-12-19 LAB — PROTEIN / CREATININE RATIO, URINE
Creatinine, Urine: 77 mg/dL
PROTEIN CREATININE RATIO: 0.17 mg/mg{creat} — AB (ref 0.00–0.15)
Total Protein, Urine: 13 mg/dL

## 2017-12-19 NOTE — MAU Provider Note (Addendum)
History     CSN: [id number]  Arrival date and time: 12/19/17 1643   First Provider Initiated Contact with Patient 12/19/17 1813      Chief Complaint  Patient presents with  . Abdominal Pain   [name] [name] is a 22 y.o. G1P0101 who is 5 days S/P LTCS for non-reassuring fetal testing. She is here today with bilateral abdominal pain and dysuria x 3 days. Patient is IDDM. She denies hx of CHTN, and was not hypertensive during her pregnancy. She denies HA, visual disturbances or RUQ pain.   Abdominal Pain  This is a new problem. The current episode started in the past 7 days. The onset quality is sudden. The problem occurs constantly. The problem has been unchanged. The pain is located in the RLQ and LLQ. The pain is at a severity of 10/10. The abdominal pain does not radiate. Associated symptoms include dysuria and nausea. Pertinent negatives include no fever, headaches or vomiting. Nothing aggravates the pain. The pain is relieved by nothing. She has tried oral narcotic analgesics for the symptoms. The treatment provided moderate relief. Her past medical history is significant for abdominal surgery.  Dysuria   This is a new problem. The current episode started in the past 7 days. The problem occurs every urination. The quality of the pain is described as aching. The pain is at a severity of 10/10. There has been no fever. Associated symptoms include nausea. Pertinent negatives include no chills or vomiting. Treatments tried: oral narcotics  The treatment provided no relief.    OB History    Gravida  1   Para  1   Term  0   Preterm  1   AB  0   Living  1     SAB  0   TAB  0   Ectopic  0   Multiple  0   Live Births  1           Past Medical History:  Diagnosis Date  . Asthma    Per pt. dx many yrs. ago, does not use meds for asthma  . Diabetes type 1, uncontrolled ([place])   . Heart murmur     Past Surgical History:  Procedure Laterality Date  . CESAREAN SECTION  N/A 12/13/2017   Procedure: CESAREAN SECTION;  Surgeon: Florian Buff, MD;  Location: [facility];  Service: Obstetrics;  Laterality: N/A;  . NO PAST SURGERIES      Family History  Problem Relation Age of Onset  . Diabetes Father   . Diabetes Maternal Grandmother   . Diabetes Paternal Grandmother   . Asthma Brother     Social History   Tobacco Use  . Smoking status: Never Smoker  . Smokeless tobacco: Never Used  Substance Use Topics  . Alcohol use: Not Currently  . Drug use: Not Currently    Allergies:  Allergies  Allergen Reactions  . Banana Itching and Swelling    Throat swelling, difficulty breathing    Medications Prior to Admission  Medication Sig Dispense Refill Last Dose  . ACCU-CHEK FASTCLIX LANCETS MISC 1 Units by Percutaneous route 4 (four) times daily. 100 each 12 Taking  . acetaminophen (TYLENOL) 500 MG tablet Take 1,000 mg by mouth every 6 (six) hours as needed.   Not Taking  . Blood Glucose Monitoring Suppl (ACCU-CHEK NANO SMARTVIEW) w/Device KIT 1 kit by Subdermal route as directed. Check blood sugars for fasting, and two hours after breakfast, lunch and dinner (4 checks  daily) 1 kit 0 Taking  . cephALEXin (KEFLEX) 500 MG capsule Take 1 capsule (500 mg total) by mouth daily. 30 capsule 3 12/13/2017 at Unknown time  . glucose blood (ACCU-CHEK SMARTVIEW) test strip Use as instructed to check blood sugars 100 each 12 Taking  . ibuprofen (ADVIL,MOTRIN) 600 MG tablet Take 1 tablet (600 mg total) by mouth every 6 (six) hours. 30 tablet 0   . insulin aspart (NOVOLOG FLEXPEN) 100 UNIT/ML FlexPen Inject 2-10 Units into the skin 4 (four) times daily as needed for high blood sugar.   12/13/2017 at Unknown time  . insulin aspart (NOVOLOG) 100 UNIT/ML injection Inject 6 Units into the skin 3 (three) times daily with meals. 10 mL 11   . insulin aspart (NOVOLOG) 100 UNIT/ML injection Inject 6 Units into the skin at bedtime. 10 mL 11   . insulin glargine (LANTUS) 100  UNIT/ML injection Inject 0.2 mLs (20 Units total) into the skin at bedtime. 10 mL 11   . oxyCODONE (OXY IR/ROXICODONE) 5 MG immediate release tablet Take 1 tablet (5 mg total) by mouth every 4 (four) hours as needed (pain scale 4-7). 30 tablet 0   . Prenatal Vit-Fe Fumarate-FA (PRENATAL MULTIVITAMIN) TABS tablet Take 1 tablet by mouth daily at 12 noon.   12/12/2017 at Unknown time    Review of Systems  Constitutional: Negative for chills and fever.  Eyes: Negative for visual disturbance.  Gastrointestinal: Positive for abdominal pain and nausea. Negative for vomiting.       Denies RUQ pain  Genitourinary: Positive for dysuria.  Neurological: Negative for headaches.   Physical Exam   Blood pressure (!) 141/88, pulse 77, temperature 98.8 F (37.1 C), temperature source Oral, resp. rate 16, height 5' (1.524 m), weight 87.5 kg, SpO2 99 %, unknown if currently breastfeeding.  Physical Exam  Nursing note and vitals reviewed. Constitutional: She is oriented to person, place, and time. She appears well-developed and well-nourished. No distress.  HENT:  Head: Normocephalic.  Cardiovascular: Normal rate.  Respiratory: Effort normal.  GI: Soft. There is no tenderness.  Honeycomb dressing removed. Incision: C/D/I healing well.   Neurological: She is alert and oriented to person, place, and time. She displays normal reflexes. She exhibits normal muscle tone (no clonus ).  Skin: Skin is warm and dry.  Psychiatric: She has a normal mood and affect.   Results for orders placed or performed during the hospital encounter of 12/19/17 (from the past 24 hour(s))  Urinalysis, Routine w reflex microscopic     Status: Abnormal   Collection Time: 12/19/17  6:22 PM  Result Value Ref Range   Color, Urine YELLOW YELLOW   APPearance CLEAR CLEAR   Specific Gravity, Urine 1.014 1.005 - 1.030   pH 7.0 5.0 - 8.0   Glucose, UA 50 (A) NEGATIVE mg/dL   Hgb urine dipstick NEGATIVE NEGATIVE   Bilirubin Urine  NEGATIVE NEGATIVE   Ketones, ur NEGATIVE NEGATIVE mg/dL   Protein, ur NEGATIVE NEGATIVE mg/dL   Nitrite NEGATIVE NEGATIVE   Leukocytes, UA NEGATIVE NEGATIVE  Protein / creatinine ratio, urine     Status: Abnormal   Collection Time: 12/19/17  6:22 PM  Result Value Ref Range   Creatinine, Urine 77.00 mg/dL   Total Protein, Urine 13 mg/dL   Protein Creatinine Ratio 0.17 (H) 0.00 - 0.15 mg/mg[Cre]  CBC with Differential/Platelet     Status: Abnormal   Collection Time: 12/19/17  6:36 PM  Result Value Ref Range   WBC 6.6 4.0 -  10.5 K/uL   RBC 3.60 (L) 3.87 - 5.11 MIL/uL   Hemoglobin 10.5 (L) 12.0 - 15.0 g/dL   HCT 30.9 (L) 36.0 - 46.0 %   MCV 85.8 78.0 - 100.0 fL   MCH 29.2 26.0 - 34.0 pg   MCHC 34.0 30.0 - 36.0 g/dL   RDW 13.3 11.5 - 15.5 %   Platelets 261 150 - 400 K/uL   Neutrophils Relative % 51 %   Neutro Abs 3.3 1.7 - 7.7 K/uL   Lymphocytes Relative 44 %   Lymphs Abs 2.9 0.7 - 4.0 K/uL   Monocytes Relative 4 %   Monocytes Absolute 0.3 0.1 - 1.0 K/uL   Eosinophils Relative 1 %   Eosinophils Absolute 0.1 0.0 - 0.7 K/uL   Basophils Relative 0 %   Basophils Absolute 0.0 0.0 - 0.1 K/uL  Comprehensive metabolic panel     Status: Abnormal   Collection Time: 12/19/17  6:36 PM  Result Value Ref Range   Sodium 134 (L) 135 - 145 mmol/L   Potassium 3.6 3.5 - 5.1 mmol/L   Chloride 103 98 - 111 mmol/L   CO2 25 22 - 32 mmol/L   Glucose, Bld 108 (H) 70 - 99 mg/dL   BUN 13 6 - 20 mg/dL   Creatinine, Ser 0.48 0.44 - 1.00 mg/dL   Calcium 7.8 (L) 8.9 - 10.3 mg/dL   Total Protein 6.2 (L) 6.5 - 8.1 g/dL   Albumin 2.5 (L) 3.5 - 5.0 g/dL   AST 21 15 - 41 U/L   ALT 22 0 - 44 U/L   Alkaline Phosphatase 57 38 - 126 U/L   Total Bilirubin 0.3 0.3 - 1.2 mg/dL   GFR calc non Af Amer >60 >60 mL/min   GFR calc Af Amer >60 >60 mL/min   Anion gap 6 5 - 15     MAU Course  Procedures  MDM 7:59 PM consult with Dr. Ihor Dow, will have patient FU for BP check tomorrow.   Assessment and  Plan   1. Status post cesarean section   2. Transient hypertension   3. Post-op pain    DC home Comfort measures reviewed  Pre-eclampsia warning signs reviewed  RX: none  Return to MAU as needed FU tomorrow for blood pressure check   Follow-up [facility] for [facility] Follow up.   Specialty:  Obstetrics and Gynecology Why:  They will call you so we can have you come in tomorrow for a blood pressure check  Contact information: [place] 12/19/2017, 6:14 PM

## 2017-12-19 NOTE — MAU Note (Signed)
Pt reports dysuria x 5 days, also reports bilateral side pain. S/p C-Section on 12/13/2017

## 2017-12-19 NOTE — Discharge Instructions (Signed)
Preeclampsia and Eclampsia °Preeclampsia is a serious condition that develops only during pregnancy. It is also called toxemia of pregnancy. This condition causes high blood pressure along with other symptoms, such as swelling and headaches. These symptoms may develop as the condition gets worse. Preeclampsia may occur at 20 weeks of pregnancy or later. °Diagnosing and treating preeclampsia early is very important. If not treated early, it can cause serious problems for you and your baby. One problem it can lead to is eclampsia, which is a condition that causes muscle jerking or shaking (convulsions or seizures) in the mother. Delivering your baby is the best treatment for preeclampsia or eclampsia. Preeclampsia and eclampsia symptoms usually go away after your baby is born. °What are the causes? °The cause of preeclampsia is not known. °What increases the risk? °The following risk factors make you more likely to develop preeclampsia: °· Being pregnant for the first time. °· Having had preeclampsia during a past pregnancy. °· Having a family history of preeclampsia. °· Having high blood pressure. °· Being pregnant with twins or triplets. °· Being 35 or older. °· Being African-American. °· Having kidney disease or diabetes. °· Having medical conditions such as lupus or blood diseases. °· Being very overweight (obese). ° °What are the signs or symptoms? °The earliest signs of preeclampsia are: °· High blood pressure. °· Increased protein in your urine. Your health care provider will check for this at every visit before you give birth (prenatal visit). ° °Other symptoms that may develop as the condition gets worse include: °· Severe headaches. °· Sudden weight gain. °· Swelling of the hands, face, legs, and feet. °· Nausea and vomiting. °· Vision problems, such as blurred or double vision. °· Numbness in the face, arms, legs, and feet. °· Urinating less than usual. °· Dizziness. °· Slurred speech. °· Abdominal pain,  especially upper abdominal pain. °· Convulsions or seizures. ° °Symptoms generally go away after giving birth. °How is this diagnosed? °There are no screening tests for preeclampsia. Your health care provider will ask you about symptoms and check for signs of preeclampsia during your prenatal visits. You may also have tests that include: °· Urine tests. °· Blood tests. °· Checking your blood pressure. °· Monitoring your baby’s heart rate. °· Ultrasound. ° °How is this treated? °You and your health care provider will determine the treatment approach that is best for you. Treatment may include: °· Having more frequent prenatal exams to check for signs of preeclampsia, if you have an increased risk for preeclampsia. °· Bed rest. °· Reducing how much salt (sodium) you eat. °· Medicine to lower your blood pressure. °· Staying in the hospital, if your condition is severe. There, treatment will focus on controlling your blood pressure and the amount of fluids in your body (fluid retention). °· You may need to take medicine (magnesium sulfate) to prevent seizures. This medicine may be given as an injection or through an IV tube. °· Delivering your baby early, if your condition gets worse. You may have your labor started with medicine (induced), or you may have a cesarean delivery. ° °Follow these instructions at home: °Eating and drinking ° °· Drink enough fluid to keep your urine clear or pale yellow. °· Eat a healthy diet that is low in sodium. Do not add salt to your food. Check nutrition labels to see how much sodium a food or beverage contains. °· Avoid caffeine. °Lifestyle °· Do not use any products that contain nicotine or tobacco, such as cigarettes   and e-cigarettes. If you need help quitting, ask your health care provider. °· Do not use alcohol or drugs. °· Avoid stress as much as possible. Rest and get plenty of sleep. °General instructions °· Take over-the-counter and prescription medicines only as told by your  health care provider. °· When lying down, lie on your side. This keeps pressure off of your baby. °· When sitting or lying down, raise (elevate) your feet. Try putting some pillows underneath your lower legs. °· Exercise regularly. Ask your health care provider what kinds of exercise are best for you. °· Keep all follow-up and prenatal visits as told by your health care provider. This is important. °How is this prevented? °To prevent preeclampsia or eclampsia from developing during another pregnancy: °· Get proper medical care during pregnancy. Your health care provider may be able to prevent preeclampsia or diagnose and treat it early. °· Your health care provider may have you take a low-dose aspirin or a calcium supplement during your next pregnancy. °· You may have tests of your blood pressure and kidney function after giving birth. °· Maintain a healthy weight. Ask your health care provider for help managing weight gain during pregnancy. °· Work with your health care provider to manage any long-term (chronic) health conditions you have, such as diabetes or kidney problems. ° °Contact a health care provider if: °· You gain more weight than expected. °· You have headaches. °· You have nausea or vomiting. °· You have abdominal pain. °· You feel dizzy or light-headed. °Get help right away if: °· You develop sudden or severe swelling anywhere in your body. This usually happens in the legs. °· You gain 5 lbs (2.3 kg) or more during one week. °· You have severe: °? Abdominal pain. °? Headaches. °? Dizziness. °? Vision problems. °? Confusion. °? Nausea or vomiting. °· You have a seizure. °· You have trouble moving any part of your body. °· You develop numbness in any part of your body. °· You have trouble speaking. °· You have any abnormal bleeding. °· You pass out. °This information is not intended to replace advice given to you by your health care provider. Make sure you discuss any questions you have with your health  care provider. °Document Released: 03/02/2000 Document Revised: 11/01/2015 Document Reviewed: 10/10/2015 °Elsevier Interactive Patient Education © 2018 [employer] ° °

## 2017-12-20 ENCOUNTER — Telehealth: Payer: Self-pay | Admitting: Family Medicine

## 2017-12-20 NOTE — Telephone Encounter (Signed)
Called patient to see if she could come in this morning for her BP check. She was not available, nor could I leave a message. I have scheduled her for Monday, and will send her a MyChart message.

## 2017-12-21 LAB — URINE CULTURE: Culture: NO GROWTH

## 2017-12-23 ENCOUNTER — Ambulatory Visit: Payer: Self-pay

## 2017-12-26 ENCOUNTER — Encounter: Payer: Self-pay | Admitting: Obstetrics & Gynecology

## 2017-12-26 ENCOUNTER — Other Ambulatory Visit: Payer: Self-pay

## 2017-12-27 ENCOUNTER — Encounter (HOSPITAL_COMMUNITY): Payer: Self-pay

## 2017-12-27 ENCOUNTER — Telehealth: Payer: Self-pay | Admitting: Family Medicine

## 2017-12-27 ENCOUNTER — Inpatient Hospital Stay (HOSPITAL_COMMUNITY)
Admission: AD | Admit: 2017-12-27 | Discharge: 2017-12-27 | Disposition: A | Discharge status: Home / Self Care | Payer: Medicaid Other | Source: Ambulatory Visit | Attending: Family Medicine | Admitting: Family Medicine

## 2017-12-27 DIAGNOSIS — O9089 Other complications of the puerperium, not elsewhere classified: Secondary | ICD-10-CM | POA: Diagnosis not present

## 2017-12-27 DIAGNOSIS — R030 Elevated blood-pressure reading, without diagnosis of hypertension: Secondary | ICD-10-CM | POA: Diagnosis not present

## 2017-12-27 LAB — CBC
HCT: 36.9 % (ref 36.0–46.0)
HEMOGLOBIN: 12.3 g/dL (ref 12.0–15.0)
MCH: 28.1 pg (ref 26.0–34.0)
MCHC: 33.3 g/dL (ref 30.0–36.0)
MCV: 84.2 fL (ref 80.0–100.0)
NRBC: 0 % (ref 0.0–0.2)
Platelets: 347 10*3/uL (ref 150–400)
RBC: 4.38 MIL/uL (ref 3.87–5.11)
RDW: 13 % (ref 11.5–15.5)
WBC: 6.2 10*3/uL (ref 4.0–10.5)

## 2017-12-27 LAB — COMPREHENSIVE METABOLIC PANEL
ALK PHOS: 67 U/L (ref 38–126)
ALT: 19 U/L (ref 0–44)
AST: 16 U/L (ref 15–41)
Albumin: 3.3 g/dL — ABNORMAL LOW (ref 3.5–5.0)
Anion gap: 8 (ref 5–15)
BUN: 15 mg/dL (ref 6–20)
CALCIUM: 8.9 mg/dL (ref 8.9–10.3)
CHLORIDE: 103 mmol/L (ref 98–111)
CO2: 24 mmol/L (ref 22–32)
CREATININE: 0.61 mg/dL (ref 0.44–1.00)
GFR calc Af Amer: >60 mL/min (ref >60)
GFR calc non Af Amer: >60 mL/min (ref >60)
GLUCOSE: 342 mg/dL — AB (ref 70–99)
Potassium: 4.7 mmol/L (ref 3.5–5.1)
SODIUM: 135 mmol/L (ref 135–145)
Total Bilirubin: 0.8 mg/dL (ref 0.3–1.2)
Total Protein: 6.8 g/dL (ref 6.5–8.1)

## 2017-12-27 MED ORDER — AMLODIPINE BESYLATE 5 MG PO TABS: 5.0000 mg | ORAL_TABLET | Freq: Every day | ORAL | 3 refills | Status: DC

## 2017-12-27 NOTE — MAU Provider Note (Signed)
History     CSN: [id number]  Arrival date and time: 12/27/17 1222   None     Chief Complaint  Patient presents with  . Hypertension   HPI This is a 22 yo G1P0 who is 14 days postpartum from a RLTCS. Pregnancy was complicated by type 1 diabetes. Was seen by home health nurse for BP check and reported it as high. Denies headache, blurred vision, RUQ pain, nausea, vomiting.  OB History    Gravida  1   Para  1   Term  0   Preterm  1   AB  0   Living  1     SAB  0   TAB  0   Ectopic  0   Multiple  0   Live Births  1           Past Medical History:  Diagnosis Date  . Asthma    Per pt. dx many yrs. ago, does not use meds for asthma  . Diabetes type 1, uncontrolled ([place])   . Heart murmur     Past Surgical History:  Procedure Laterality Date  . CESAREAN SECTION N/A 12/13/2017   Procedure: CESAREAN SECTION;  Surgeon: Florian Buff, MD;  Location: [facility];  Service: Obstetrics;  Laterality: N/A;  . NO PAST SURGERIES      Family History  Problem Relation Age of Onset  . Diabetes Father   . Diabetes Maternal Grandmother   . Diabetes Paternal Grandmother   . Asthma Brother     Social History   Tobacco Use  . Smoking status: Never Smoker  . Smokeless tobacco: Never Used  Substance Use Topics  . Alcohol use: Not Currently  . Drug use: Not Currently    Allergies:  Allergies  Allergen Reactions  . Banana Itching and Swelling    Throat swelling, difficulty breathing    Medications Prior to Admission  Medication Sig Dispense Refill Last Dose  . ACCU-CHEK FASTCLIX LANCETS MISC 1 Units by Percutaneous route 4 (four) times daily. 100 each 12 Taking  . acetaminophen (TYLENOL) 500 MG tablet Take 1,000 mg by mouth every 6 (six) hours as needed.   Not Taking  . Blood Glucose Monitoring Suppl (ACCU-CHEK NANO SMARTVIEW) w/Device KIT 1 kit by Subdermal route as directed. Check blood sugars for fasting, and two hours after breakfast, lunch and  dinner (4 checks daily) 1 kit 0 Taking  . cephALEXin (KEFLEX) 500 MG capsule Take 1 capsule (500 mg total) by mouth daily. 30 capsule 3 12/13/2017 at Unknown time  . glucose blood (ACCU-CHEK SMARTVIEW) test strip Use as instructed to check blood sugars 100 each 12 Taking  . ibuprofen (ADVIL,MOTRIN) 600 MG tablet Take 1 tablet (600 mg total) by mouth every 6 (six) hours. 30 tablet 0   . insulin aspart (NOVOLOG FLEXPEN) 100 UNIT/ML FlexPen Inject 2-10 Units into the skin 4 (four) times daily as needed for high blood sugar.   12/13/2017 at Unknown time  . insulin aspart (NOVOLOG) 100 UNIT/ML injection Inject 6 Units into the skin 3 (three) times daily with meals. 10 mL 11   . insulin aspart (NOVOLOG) 100 UNIT/ML injection Inject 6 Units into the skin at bedtime. 10 mL 11   . insulin glargine (LANTUS) 100 UNIT/ML injection Inject 0.2 mLs (20 Units total) into the skin at bedtime. 10 mL 11   . oxyCODONE (OXY IR/ROXICODONE) 5 MG immediate release tablet Take 1 tablet (5 mg total) by mouth every 4 (four)  hours as needed (pain scale 4-7). 30 tablet 0   . Prenatal Vit-Fe Fumarate-FA (PRENATAL MULTIVITAMIN) TABS tablet Take 1 tablet by mouth daily at 12 noon.   12/12/2017 at Unknown time    Review of Systems Physical Exam   Blood pressure 134/76, pulse 83, temperature 98.3 F (36.8 C), temperature source Oral, resp. rate 16, weight 81.3 kg, SpO2 98 %, unknown if currently breastfeeding.  Patient Vitals for the past 24 hrs:  BP Temp Temp src Pulse Resp SpO2 Weight  12/27/17 1316 135/80 - - 80 - 97 % -  12/27/17 1301 128/74 - - 80 - 97 % -  12/27/17 1246 131/79 - - 84 - 98 % -  12/27/17 1242 134/76 98.3 F (36.8 C) Oral 83 16 98 % -  12/27/17 1235 - - - - - - 81.3 kg     Physical Exam  Constitutional: She is oriented to person, place, and time. She appears well-developed and well-nourished.  HENT:  Head: Normocephalic and atraumatic.  Right Ear: External ear normal.  Left Ear: External ear normal.   Eyes: Pupils are equal, round, and reactive to light.  Neck: Normal range of motion. Neck supple.  Cardiovascular: Normal rate, regular rhythm and normal heart sounds.  Respiratory: Effort normal and breath sounds normal.  GI: Soft. Bowel sounds are normal. She exhibits no distension. There is no tenderness. There is no rebound.  Neurological: She is alert and oriented to person, place, and time.  Skin: Skin is warm and dry.  Psychiatric: She has a normal mood and affect. Her behavior is normal. Judgment and thought content normal.   Results for orders placed or performed during the hospital encounter of 12/27/17 (from the past 24 hour(s))  CBC     Status: None   Collection Time: 12/27/17 12:41 PM  Result Value Ref Range   WBC 6.2 4.0 - 10.5 K/uL   RBC 4.38 3.87 - 5.11 MIL/uL   Hemoglobin 12.3 12.0 - 15.0 g/dL   HCT 36.9 36.0 - 46.0 %   MCV 84.2 80.0 - 100.0 fL   MCH 28.1 26.0 - 34.0 pg   MCHC 33.3 30.0 - 36.0 g/dL   RDW 13.0 11.5 - 15.5 %   Platelets 347 150 - 400 K/uL   nRBC 0.0 0.0 - 0.2 %  Comprehensive metabolic panel     Status: Abnormal   Collection Time: 12/27/17 12:41 PM  Result Value Ref Range   Sodium 135 135 - 145 mmol/L   Potassium 4.7 3.5 - 5.1 mmol/L   Chloride 103 98 - 111 mmol/L   CO2 24 22 - 32 mmol/L   Glucose, Bld 342 (H) 70 - 99 mg/dL   BUN 15 6 - 20 mg/dL   Creatinine, Ser 0.61 0.44 - 1.00 mg/dL   Calcium 8.9 8.9 - 10.3 mg/dL   Total Protein 6.8 6.5 - 8.1 g/dL   Albumin 3.3 (L) 3.5 - 5.0 g/dL   AST 16 15 - 41 U/L   ALT 19 0 - 44 U/L   Alkaline Phosphatase 67 38 - 126 U/L   Total Bilirubin 0.8 0.3 - 1.2 mg/dL   GFR calc non Af Amer >60 >60 mL/min   GFR calc Af Amer >60 >60 mL/min   Anion gap 8 5 - 15    MAU Course  Procedures  MDM BPs normal, labs normal.  Assessment and Plan  1. Elevated blood pressures D/c to home. Start amlodipine '5mg'$  daily.  Truett Mainland 12/27/2017, 12:49  PM  

## 2017-12-27 NOTE — Telephone Encounter (Signed)
Received message from Dene Gentry from [facility] after noting patient had two elevated blood pressures of 170/96 and 182/100 during home visit today. Recently went to the MAU on 10/3 with SBP 140s. Called back Ms. Cox at [phone], left a voicemail with my contact information. Also tried reaching the patient at her home and cell phone number, additional left a message with my contact information.  If patient calls back, plan would be for her to go to the MAU for further evaluation of elevated BP's 2 weeks PP.   Darrelyn Hillock, DO Family Medicine PGY-1

## 2017-12-27 NOTE — MAU Note (Signed)
Pt had two elevated pressures at home visit today.  Delivered 12/13/17 c/s. No HA, or visual changes

## 2017-12-27 NOTE — Telephone Encounter (Signed)
Received a call back from Dene Gentry, with [facility]. Recommended to her that patient be evaluated at the MAU. Ms. Tobie Poet states she will contact the patient and discuss this with her.   [place] Medicine PGY-1

## 2017-12-27 NOTE — Discharge Instructions (Signed)

## 2017-12-30 ENCOUNTER — Ambulatory Visit: Payer: Self-pay

## 2018-01-02 ENCOUNTER — Ambulatory Visit (INDEPENDENT_AMBULATORY_CARE_PROVIDER_SITE_OTHER): Payer: Medicaid Other | Admitting: *Deleted

## 2018-01-02 VITALS — BP 126/83 | HR 98 | Temp 98.4°F | Wt 168.9 lb

## 2018-01-02 DIAGNOSIS — Z5189 Encounter for other specified aftercare: Secondary | ICD-10-CM

## 2018-01-02 NOTE — Progress Notes (Signed)
Here for wound check. C/s 12/13/17. Denies any problems. Wound intact with staples.  Crusty dried serous/ beige discharge over most of incision, incision intact with one area open slighty. Poured saline over incision and on gauze and tried to loosen crusty drainage. Some removed. Dr. Kennon Rounds in at my request to check incision. She removed crusty discharge with saline/ gauze and applied silver nitrate to small open area noted or patients left about 1/4 from edge.  Instructed patient to shower daily and keep wound clean and dry- may need to dry with clean tissue several times daily due to skin overhang. Advised to watch for fever, wound discharge, redness, edema, etc and to go to mau if this develops. Also advised to keep pp appt. As scheduled. She voices understanding.

## 2018-01-03 NOTE — Progress Notes (Signed)
Patient seen and assessed by nursing staff.  Agree with documentation and plan.  

## 2018-01-16 ENCOUNTER — Ambulatory Visit (INDEPENDENT_AMBULATORY_CARE_PROVIDER_SITE_OTHER): Payer: Medicaid Other | Admitting: Family Medicine

## 2018-01-16 ENCOUNTER — Encounter: Payer: Self-pay | Admitting: Family Medicine

## 2018-01-16 DIAGNOSIS — Z1389 Encounter for screening for other disorder: Secondary | ICD-10-CM

## 2018-01-16 DIAGNOSIS — O165 Unspecified maternal hypertension, complicating the puerperium: Secondary | ICD-10-CM

## 2018-01-16 DIAGNOSIS — Z3042 Encounter for surveillance of injectable contraceptive: Secondary | ICD-10-CM | POA: Diagnosis present

## 2018-01-16 DIAGNOSIS — E109 Type 1 diabetes mellitus without complications: Secondary | ICD-10-CM

## 2018-01-16 LAB — POCT PREGNANCY, URINE: PREG TEST UR: NEGATIVE

## 2018-01-16 LAB — POCT URINALYSIS DIP (DEVICE)
Bilirubin Urine: NEGATIVE
GLUCOSE, UA: 500 mg/dL — AB
Hgb urine dipstick: NEGATIVE
KETONES UR: NEGATIVE mg/dL
LEUKOCYTES UA: NEGATIVE
Nitrite: NEGATIVE
PROTEIN: NEGATIVE mg/dL
SPECIFIC GRAVITY, URINE: 1.01 (ref 1.005–1.030)
Urobilinogen, UA: 0.2 mg/dL (ref 0.0–1.0)
pH: 6.5 (ref 5.0–8.0)

## 2018-01-16 LAB — GLUCOSE, CAPILLARY: GLUCOSE-CAPILLARY: 279 mg/dL — AB (ref 70–99)

## 2018-01-16 MED ORDER — MEDROXYPROGESTERONE ACETATE 150 MG/ML IM SUSP
150.0000 mg | Freq: Once | INTRAMUSCULAR | Status: AC
  Administered 2018-01-16: 150 mg via INTRAMUSCULAR

## 2018-01-16 NOTE — Progress Notes (Signed)
Subjective:     [name] [name] is a 22 y.o. female who presents for a postpartum visit. She is 5 weeks postpartum following a low cervical transverse Cesarean section. I have fully reviewed the prenatal and intrapartum course. The delivery was at 33.6 gestational weeks. Outcome: primary cesarean section, low transverse incision. Anesthesia: spinal. Postpartum course has been unremarkable. Baby's course has been unremarkable. Baby is feeding by bottle - Similac Neosure and pumped breast milk, still in NICU. States no bleeding. Bowel function is normal. Bladder function is normal. Patient is not sexually active. Contraception method is Depo-Provera injections. Postpartum depression screening: negative (Edinburgh 16 but feels like good family support, just feeling sad about baby being in NICU, coping by listening to music, taking walks, trying to handle "on own")   Blood Pressure - elevated 2 weeks postpartum, started on amlodipine - denies headaches, vision changes, any more abdominal pain   Type I DM - primary C/S at 33w6 because of BPP 6/10 then repeated at 2/8, polyhydramnios, poorly controlled diabetes  - lots of glucose in urine  - BG has ranged from 40s to 500s (right before coming here), BG 279 here - Novolog 1:7 every 25 above 125 take 2U - averages 8-10U), Lantus 40 at night - fasting BG 200s  wants referral to endocrinologist   Rh Negative - got Rhogam in hospital   The following portions of the patient's history were reviewed and updated as appropriate: allergies, current medications, past family history, past medical history, past social history, past surgical history and problem list.  Review of Systems Pertinent items are noted in HPI.   Objective:    BP 138/86   Pulse (!) 108   Wt 175 lb (79.4 kg)   BMI 34.18 kg/m   General:  well-appearing, NAD   Breasts:  ideferred  Lungs: clear to auscultation bilaterally  Heart:  regular rate and rhythm, S1, S2 normal, no murmur,  click, rub or gallop  Abdomen: soft, non-tender; bowel sounds normal; no masses,  no organomegaly  incision healing well   GU:  deferred        Assessment:   Routine postpartum exam.   Plan:   1. Contraception: Depo-Provera injections, given today  2. Pap Smear: UTD (May 2019)  3. Edinburgh: 38 - declines meeting with behavioral health today, feels like has good support and good coping mechanisms 4. Type I DM: Continue Lantus and Novolog per sliding scale. Reports having plenty of refills. Poorly controlled at this time. Referral placed to follow-up with outpatient endocrinologist. Random BG today 279 5. Postpartum Hypertension: Continue amlodipine until follow-up with PCP  3. Follow-up in: 3 months or as needed.

## 2018-01-23 DIAGNOSIS — I10 Essential (primary) hypertension: Secondary | ICD-10-CM | POA: Insufficient documentation

## 2018-02-05 ENCOUNTER — Institutional Professional Consult (permissible substitution): Payer: Self-pay

## 2018-02-05 NOTE — BH Specialist Note (Deleted)
Integrated Behavioral Health Initial Visit  MRN: [id number] Name: [name] [name]  Number of [place] Clinician visits:: 1/6 Session Start time: ***  Session End time: *** Total time: {IBH Total Time:21014050}  Type of Service: [place] Interpretor:No. Interpretor Name and Language: n/a   Warm Hand Off Completed.       SUBJECTIVE: [name] [name] is a 22 y.o. female accompanied by {CHL AMB ACCOMPANIED BB:0488891694} Patient was referred by Gerri Lins, DO for positive depression screen. Patient reports the following symptoms/concerns: *** Duration of problem: ***; Severity of problem: {Mild/Moderate/Severe:20260}  OBJECTIVE: Mood: {BHH MOOD:22306} and Affect: {BHH AFFECT:22307} Risk of harm to self or others: {CHL AMB BH Suicide Current Mental Status:21022748}  LIFE CONTEXT: Family and Social: *** School/Work: *** Self-Care: *** Life Changes: Recent childbirth  GOALS ADDRESSED: Patient will: 1. Reduce symptoms of: {IBH Symptoms:21014056} 2. Increase knowledge and/or ability of: {IBH Patient Tools:21014057}  3. Demonstrate ability to: {IBH Goals:21014053}  INTERVENTIONS: Interventions utilized: {IBH Interventions:21014054}  Standardized Assessments completed: GAD-7 and PHQ 9  ASSESSMENT: Patient currently experiencing ***.   Patient may benefit from psychoeducation and brief therapeutic interventions regarding coping with symptoms of *** .  PLAN: 1. Follow up with behavioral health clinician on : *** 2. Behavioral recommendations: *** 3. Referral(s): {IBH Referrals:21014055} 4. "From scale of 1-10, how likely are you to follow plan?": ***  Garlan Fair, LCSW  Depression screen [facility] 2/9 12/12/2017 12/05/2017 11/14/2017 11/06/2017 10/31/2017  Decreased Interest 1 1 2 1 1   Down, Depressed, Hopeless 0 1 1 1 1   PHQ - 2 Score 1 2 3 2 2   Altered sleeping 2 3 2 1 1   Tired, decreased energy 1 2 2 1 1   Change  in appetite 0 0 0 0 0  Feeling bad or failure about yourself  0 0 0 0 0  Trouble concentrating 0 0 0 1 1  Moving slowly or fidgety/restless 0 0 0 0 0  Suicidal thoughts 0 0 0 0 0  PHQ-9 Score 4 7 7 5 5    GAD 7 : Generalized Anxiety Score 12/12/2017 12/05/2017 11/14/2017 11/06/2017  Nervous, Anxious, on Edge 1 2 1 1   Control/stop worrying 1 2 1 1   Worry too much - different things 1 2 1 1   Trouble relaxing 0 1 1 0  Restless 0 0 0 0  Easily annoyed or irritable 0 0 0 0  Afraid - awful might happen 1 0 1 1  Total GAD 7 Score 4 7 5  4

## 2018-02-20 ENCOUNTER — Encounter: Payer: Self-pay | Admitting: Endocrinology

## 2018-03-24 ENCOUNTER — Emergency Department (HOSPITAL_COMMUNITY)
Admission: EM | Admit: 2018-03-24 | Discharge: 2018-03-24 | Disposition: A | Discharge status: Home / Self Care | Payer: Medicaid Other | Attending: Emergency Medicine | Admitting: Emergency Medicine

## 2018-03-24 ENCOUNTER — Other Ambulatory Visit: Payer: Self-pay

## 2018-03-24 ENCOUNTER — Encounter (HOSPITAL_COMMUNITY): Payer: Self-pay | Admitting: Emergency Medicine

## 2018-03-24 DIAGNOSIS — Z794 Long term (current) use of insulin: Secondary | ICD-10-CM | POA: Insufficient documentation

## 2018-03-24 DIAGNOSIS — Z91040 Latex allergy status: Secondary | ICD-10-CM | POA: Insufficient documentation

## 2018-03-24 DIAGNOSIS — R300 Dysuria: Secondary | ICD-10-CM | POA: Diagnosis present

## 2018-03-24 DIAGNOSIS — E1065 Type 1 diabetes mellitus with hyperglycemia: Secondary | ICD-10-CM | POA: Diagnosis not present

## 2018-03-24 DIAGNOSIS — R739 Hyperglycemia, unspecified: Secondary | ICD-10-CM

## 2018-03-24 LAB — BASIC METABOLIC PANEL
Anion gap: 9 (ref 5–15)
BUN: 9 mg/dL (ref 6–20)
CO2: 22 mmol/L (ref 22–32)
Calcium: 8.9 mg/dL (ref 8.9–10.3)
Chloride: 103 mmol/L (ref 98–111)
Creatinine, Ser: 0.77 mg/dL (ref 0.44–1.00)
GFR calc Af Amer: >60 mL/min (ref >60)
GFR calc non Af Amer: >60 mL/min (ref >60)
Glucose, Bld: 697 mg/dL (ref 70–99)
Potassium: 4.8 mmol/L (ref 3.5–5.1)
Sodium: 134 mmol/L — ABNORMAL LOW (ref 135–145)

## 2018-03-24 LAB — URINALYSIS, ROUTINE W REFLEX MICROSCOPIC
BACTERIA UA: NONE SEEN
BILIRUBIN URINE: NEGATIVE
Glucose, UA: >=500 mg/dL — AB
KETONES UR: 5 mg/dL — AB
LEUKOCYTES UA: NEGATIVE
NITRITE: NEGATIVE
PH: 6 (ref 5.0–8.0)
Protein, ur: NEGATIVE mg/dL
Specific Gravity, Urine: 1.031 — ABNORMAL HIGH (ref 1.005–1.030)

## 2018-03-24 LAB — CBC
HEMATOCRIT: 36.9 % (ref 36.0–46.0)
HEMOGLOBIN: 12.4 g/dL (ref 12.0–15.0)
MCH: 28.8 pg (ref 26.0–34.0)
MCHC: 33.6 g/dL (ref 30.0–36.0)
MCV: 85.8 fL (ref 80.0–100.0)
NRBC: 0 % (ref 0.0–0.2)
Platelets: 232 10*3/uL (ref 150–400)
RBC: 4.3 MIL/uL (ref 3.87–5.11)
RDW: 14.4 % (ref 11.5–15.5)
WBC: 5.8 10*3/uL (ref 4.0–10.5)

## 2018-03-24 LAB — CBG MONITORING, ED
GLUCOSE-CAPILLARY: 320 mg/dL — AB (ref 70–99)
Glucose-Capillary: 594 mg/dL (ref 70–99)
Glucose-Capillary: 484 mg/dL — ABNORMAL HIGH (ref 70–99)

## 2018-03-24 LAB — I-STAT BETA HCG BLOOD, ED (MC, WL, AP ONLY): I-stat hCG, quantitative: <5 m[IU]/mL (ref <5)

## 2018-03-24 MED ORDER — SODIUM CHLORIDE 0.9 % IV BOLUS
1000.0000 mL | Freq: Once | INTRAVENOUS | Status: AC
  Administered 2018-03-24: 1000 mL via INTRAVENOUS

## 2018-03-24 MED ORDER — METRONIDAZOLE 500 MG PO TABS: 500.0000 mg | ORAL_TABLET | Freq: Two times a day (BID) | ORAL | 0 refills | Status: DC

## 2018-03-24 MED ORDER — INSULIN GLARGINE 100 UNIT/ML ~~LOC~~ SOLN: 50.0000 [IU] | Freq: Every day | SUBCUTANEOUS | 0 refills | Status: AC

## 2018-03-24 MED ORDER — INSULIN ASPART 100 UNIT/ML ~~LOC~~ SOLN
10.0000 [IU] | Freq: Once | SUBCUTANEOUS | Status: AC
  Administered 2018-03-24: 10 [IU] via INTRAVENOUS

## 2018-03-24 NOTE — ED Triage Notes (Signed)
Onset one week ago developed feeling her blood sugar is high because of urinary frequency and increase of thirst. CBG in triage 594.

## 2018-03-24 NOTE — ED Provider Notes (Signed)
[facility] EMERGENCY DEPARTMENT Provider Note   CSN: [id number] Arrival date & time: 03/24/18  1518     History   Chief Complaint Chief Complaint  Patient presents with  . Hyperglycemia    HPI [name] [name] is a 23 y.o. female.  HPI Concern of feeling weak, nauseous, with diarrhea, and concern for hyperglycemia. Patient has history of insulin-dependent diabetes, states that she takes her medication regularly, including insulin. She also complains of polyuria, mild dysuria, no abdominal pain, no fever. Since onset about 3 days ago, no clear alleviating or exacerbating factors, but with progression of symptoms she presents for evaluation. He has not seen her physician during this illness.  Past Medical History:  Diagnosis Date  . Asthma    Per pt. dx many yrs. ago, does not use meds for asthma  . Cesarean delivery delivered 12/14/2017  . Diabetes type 1, uncontrolled ([place])   . Heart murmur   . Migraines 10/08/2017  . Polyhydramnios affecting pregnancy 10/09/2017   Dx on 7/23. 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 Pyelonephritis affecting pregnancy in third trimester 11/19/2017    Patient Active Problem List   Diagnosis Date Noted  . Postpartum hypertension 01/23/2018  . Fibroid 12/13/2017  . History of C-section 12/13/2017  . History of asthma 10/30/2017  . Severe obesity (BMI 35.0-39.9) with comorbidity ([place]) 10/08/2017  . DM (diabetes mellitus), type 1 ([place]) 10/07/2017    Past Surgical History:  Procedure Laterality Date  . CESAREAN SECTION N/A 12/13/2017   Procedure: CESAREAN SECTION;  Surgeon: Florian Buff, MD;  Location: [facility];  Service: Obstetrics;  Laterality: N/A;  . NO PAST SURGERIES       OB History    Gravida  1   Para  1   Term  0   Preterm  1   AB  0   Living  1     SAB  0   TAB  0   Ectopic  0   Multiple  0   Live Births  1            Home Medications    Prior to Admission medications   Medication Sig Start Date End  Date Taking? Authorizing Provider  ibuprofen (ADVIL,MOTRIN) 200 MG tablet Take 800-1,000 mg by mouth every 6 (six) hours as needed (for pain or headaches).   Yes [provider]  insulin aspart (NOVOLOG FLEXPEN) 100 UNIT/ML FlexPen Inject into the skin 4 (four) times daily as needed for high blood sugar (1 UNIT FOR EVERY 7 CARBS).    Yes [provider]  insulin glargine (LANTUS) 100 UNIT/ML injection Inject 0.2 mLs (20 Units total) into the skin at bedtime. Patient taking differently: Inject 40 Units into the skin at bedtime.  12/16/17  Yes Florian Buff, MD  Prenatal Vit-Fe Fumarate-FA (PRENATAL PO) Take 1 tablet by mouth daily.   Yes [provider]  ACCU-CHEK FASTCLIX LANCETS MISC 1 Units by Percutaneous route 4 (four) times daily. 10/31/17   Truett Mainland, DO  amLODipine (NORVASC) 5 MG tablet Take 1 tablet (5 mg total) by mouth daily. Patient not taking: Reported on 03/24/2018 12/27/17   Truett Mainland, DO  Blood Glucose Monitoring Suppl (ACCU-CHEK NANO SMARTVIEW) w/Device KIT 1 kit by Subdermal route as directed. Check blood sugars for fasting, and two hours after breakfast, lunch and dinner (4 checks daily) 10/31/17   Truett Mainland, DO  glucose blood (ACCU-CHEK SMARTVIEW) test strip Use as instructed to check  blood sugars 10/31/17   Truett Mainland, DO  ibuprofen (ADVIL,MOTRIN) 600 MG tablet Take 1 tablet (600 mg total) by mouth every 6 (six) hours. Patient not taking: Reported on 03/24/2018 12/16/17   Florian Buff, MD    Family History Family History  Problem Relation Age of Onset  . Diabetes Father   . Diabetes Maternal Grandmother   . Diabetes Paternal Grandmother   . Asthma Brother     Social History Social History   Tobacco Use  . Smoking status: Never Smoker  . Smokeless tobacco: Never Used  Substance Use Topics  . Alcohol use: Not Currently  . Drug use: Not Currently     Allergies   Banana and Latex   Review of Systems Review of  Systems  Constitutional:       Per HPI, otherwise negative  HENT:       Per HPI, otherwise negative  Respiratory:       Per HPI, otherwise negative  Cardiovascular:       Per HPI, otherwise negative  Gastrointestinal: Positive for diarrhea and nausea. Negative for abdominal pain and vomiting.  Endocrine:       Negative aside from HPI  Genitourinary:       Neg aside from HPI   Musculoskeletal:       Per HPI, otherwise negative  Skin: Negative.   Neurological: Negative for syncope.     Physical Exam Updated Vital Signs BP 122/75   Pulse (!) 104   Temp 98.9 F (37.2 C) (Oral)   Resp 18   Ht 5' (1.524 m)   Wt 79.4 kg   SpO2 100%   BMI 34.18 kg/m   Physical Exam Vitals signs and nursing note reviewed.  Constitutional:      General: She is not in acute distress.    Appearance: She is well-developed.  HENT:     Head: Normocephalic and atraumatic.  Eyes:     Conjunctiva/sclera: Conjunctivae normal.  Cardiovascular:     Rate and Rhythm: Regular rhythm. Tachycardia present.  Pulmonary:     Effort: Pulmonary effort is normal. No respiratory distress.     Breath sounds: Normal breath sounds. No stridor.  Abdominal:     General: There is no distension.     Tenderness: There is no abdominal tenderness. There is no guarding.  Skin:    General: Skin is warm and dry.  Neurological:     Mental Status: She is alert and oriented to person, place, and time.     Cranial Nerves: No cranial nerve deficit.      ED Treatments / Results  Labs (all labs ordered are listed, but only abnormal results are displayed) Labs Reviewed  BASIC METABOLIC PANEL - Abnormal; Notable for the following components:      Result Value   Sodium 134 (*)    Glucose, Bld 697 (*)    All other components within normal limits  URINALYSIS, ROUTINE W REFLEX MICROSCOPIC - Abnormal; Notable for the following components:   Color, Urine STRAW (*)    Specific Gravity, Urine 1.031 (*)    Glucose, UA >=500  (*)    Hgb urine dipstick MODERATE (*)    Ketones, ur 5 (*)    All other components within normal limits  CBG MONITORING, ED - Abnormal; Notable for the following components:   Glucose-Capillary 594 (*)    All other components within normal limits  CBG MONITORING, ED - Abnormal; Notable for the following components:  Glucose-Capillary 484 (*)    All other components within normal limits  CBG MONITORING, ED - Abnormal; Notable for the following components:   Glucose-Capillary 320 (*)    All other components within normal limits  CBC  I-STAT BETA HCG BLOOD, ED ([facility], [facility], [facility] ONLY)    EKG None  Radiology No results found.  Procedures Procedures (including critical care time)  Medications Ordered in ED Medications  sodium chloride 0.9 % bolus 1,000 mL (0 mLs Intravenous Stopped 03/24/18 2051)  insulin aspart (novoLOG) injection 10 Units (10 Units Intravenous Given 03/24/18 1942)     Initial Impression / Assessment and Plan / ED Course  I have reviewed the triage vital signs and the nursing notes.  Pertinent labs & imaging results that were available during my care of the patient were reviewed by me and considered in my medical decision making (see chart for details).    Initial labs notable for hyperglycemia, no anion gap. With persistent nausea, weakness, hyperglycemia, patient will receive fluids, insulin.    9:24 PM Patient in no distress, states that she feels better. Sugar has reduced to 320. We lengthy conversation about today's presentation, absence of evidence for DKA, absence of abdominal pain, no evidence for PID, some suspicion for patient's hyperglycemia contributing to her dysuria, polyuria. Patient will follow-up with her primary care physician, and women's health team, as needed. She notes that she has referral already for endocrinology, which she was encouraged to maintain. With improvement here she is appropriate for discharge with slight adjustment in her  medications, return precautions, follow-up instructions.  Final Clinical Impressions(s) / ED Diagnoses  Hyperglycemia Dysuria   Carmin Muskrat, MD 03/24/18 2125

## 2018-03-24 NOTE — Discharge Instructions (Signed)
As discussed, your evaluation today has been largely reassuring.  But, it is important that you monitor your condition carefully, and do not hesitate to return to the ED if you develop new, or concerning changes in your condition. ? ?Otherwise, please follow-up with your physician for appropriate ongoing care. ? ?

## 2018-04-04 ENCOUNTER — Ambulatory Visit: Payer: Self-pay

## 2018-07-09 ENCOUNTER — Encounter: Payer: Self-pay | Admitting: Cardiology

## 2018-07-09 ENCOUNTER — Other Ambulatory Visit: Payer: Self-pay

## 2018-07-09 ENCOUNTER — Ambulatory Visit: Payer: Medicaid Other | Admitting: Cardiology

## 2018-07-09 VITALS — BP 131/82 | HR 144 | Ht 60.0 in | Wt 141.0 lb

## 2018-07-09 DIAGNOSIS — R000 Tachycardia, unspecified: Secondary | ICD-10-CM | POA: Diagnosis not present

## 2018-07-09 DIAGNOSIS — I10 Essential (primary) hypertension: Secondary | ICD-10-CM | POA: Diagnosis not present

## 2018-07-09 DIAGNOSIS — IMO0001 Reserved for inherently not codable concepts without codable children: Secondary | ICD-10-CM

## 2018-07-09 DIAGNOSIS — E782 Mixed hyperlipidemia: Secondary | ICD-10-CM | POA: Diagnosis not present

## 2018-07-09 DIAGNOSIS — E1065 Type 1 diabetes mellitus with hyperglycemia: Secondary | ICD-10-CM

## 2018-07-09 MED ORDER — ROSUVASTATIN CALCIUM 20 MG PO TABS: 20.0000 mg | ORAL_TABLET | Freq: Every day | ORAL | 11 refills | Status: AC

## 2018-07-09 MED ORDER — ASPIRIN EC 81 MG PO TBEC: 81.0000 mg | DELAYED_RELEASE_TABLET | Freq: Every day | ORAL | 3 refills | Status: DC

## 2018-07-09 MED ORDER — LISINOPRIL 5 MG PO TABS: 5.0000 mg | ORAL_TABLET | Freq: Every day | ORAL | 3 refills | Status: DC

## 2018-07-09 NOTE — Progress Notes (Signed)
Virtual Visit via Video Note   Subjective:   [name] [name], female    DOB: 1995-09-16, 23 y.o.   MRN: [id number]   I connected with the patient on 07/09/18 by a video enabled telemedicine application and verified that I am speaking with the correct person using two identifiers.     I discussed the limitations of evaluation and management by telemedicine and the availability of in person appointments. The patient expressed understanding and agreed to proceed.   This visit type was conducted due to national recommendations for restrictions regarding the COVID-19 Pandemic (e.g. social distancing).  This format is felt to be most appropriate for this patient at this time.  All issues noted in this document were discussed and addressed.  No physical exam was performed (except for noted visual exam findings with Tele health visits).  The patient has consented to conduct a Tele health visit and understands insurance will be billed.     Chief complaint:  Palpitations  HPI  23 y.o. African American female with hypertension, hyperlipidemia, uncontrolled type 1 DM, referred for evaluation of palpitations.  Patient has been experiencing elevated heart rate for last two months, ranigng 120-140 bpm. This somewhat improved with rest. While she does not necessarily notice palpitations, fast heart rate is associated with shortness of breath, feeling lightheaded, "seeing floaters". She has never had syncope. She also notices sharp chest pain that lasts anywhere from 10 min to several hours. Chest pain is not related to physical activity, and usually occurs at rest. She denies orthopnea, PND, leg swelling.   Patient lives with her parents, 62 month old baby boy, and 3 siblings. 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. She is currently staying home during the pandemic.   She has uncontrolled type 1 DM, diagnosed at age 22. She had  been on insulin pump till the age 89, after which it was reportedly not approved by her insurance. While she does carb counting and endorses compliance with insulin, her diabetes remains extremely uncontrolled. She is currently not on aspirin, statin, or ACEi. She has asthma, but uses albuterol inhaler rarely-once or twice a month.     Past Medical History:  Diagnosis Date  . Asthma    Per pt. dx many yrs. ago, does not use meds for asthma  . Cesarean delivery delivered 12/14/2017  . Diabetes type 1, uncontrolled ([place])   . Heart murmur   . Migraines 10/08/2017  . Polyhydramnios affecting pregnancy 10/09/2017   Dx on 7/23. MVP 8.29  . Pyelonephritis affecting pregnancy in third trimester 11/19/2017     Past Surgical History:  Procedure Laterality Date  . CESAREAN SECTION N/A 12/13/2017   Procedure: CESAREAN SECTION;  Surgeon: Florian Buff, MD;  Location: [facility];  Service: Obstetrics;  Laterality: N/A;  . NO PAST SURGERIES       Social History   Socioeconomic History  . Marital status: Single    Spouse name: Not on file  . Number of children: Not on file  . Years of education: Not on file  . Highest education level: Not on file  Occupational History  . Not on file  Social Needs  . Financial resource strain: Not on file  . Food insecurity:    Worry: Not on file    Inability: Not on file  . Transportation needs:    Medical: Not on file    Non-medical: Not on file  Tobacco Use  . Smoking status: Never Smoker  . Smokeless tobacco: Never Used  Substance and Sexual Activity  . Alcohol use: Not Currently  . Drug use: Not Currently  . Sexual activity: Not Currently  Lifestyle  . Physical activity:    Days per week: Not on file    Minutes per session: Not on file  . Stress: Not on file  Relationships  . Social connections:    Talks on phone: Not on file    Gets together: Not on file    Attends religious service: Not on file    Active member of club or  organization: Not on file    Attends meetings of clubs or organizations: Not on file    Relationship status: Not on file  . Intimate partner violence:    Fear of current or ex partner: Not on file    Emotionally abused: Not on file    Physically abused: Not on file    Forced sexual activity: Not on file  Other Topics Concern  . Not on file  Social History Narrative  . Not on file     Family History  Problem Relation Age of Onset  . Diabetes Father   . Diabetes Maternal Grandmother   . Diabetes Paternal Grandmother   . Asthma Brother      Current Outpatient Medications on File Prior to Visit  Medication Sig Dispense Refill  . ACCU-CHEK FASTCLIX LANCETS MISC 1 Units by Percutaneous route 4 (four) times daily. 100 each 12  . albuterol (VENTOLIN HFA) 108 (90 Base) MCG/ACT inhaler Inhale 1-2 puffs into the lungs as needed.    Marland Kitchen amLODipine (NORVASC) 10 MG tablet Take 1 tablet by mouth daily.    . Blood Glucose Monitoring Suppl (ACCU-CHEK NANO SMARTVIEW) w/Device KIT 1 kit by Subdermal route as directed. Check blood sugars for fasting, and two hours after breakfast, lunch and dinner (4 checks daily) 1 kit 0  . glucose blood (ACCU-CHEK SMARTVIEW) test strip Use as instructed to check blood sugars 100 each 12  . insulin aspart (NOVOLOG FLEXPEN) 100 UNIT/ML FlexPen Inject into the skin 4 (four) times daily as needed for high blood sugar (1 UNIT FOR EVERY 7 CARBS).     Marland Kitchen insulin glargine (LANTUS) 100 UNIT/ML injection Inject 0.5 mLs (50 Units total) into the skin at bedtime. 10 mL 0  . medroxyPROGESTERone (DEPO-PROVERA) 150 MG/ML injection Inject 150 mg into the muscle every 3 (three) months.     No current facility-administered medications on file prior to visit.     Cardiovascular studies:  EKG 06/30/2018: Sinus tachycardia 120 bpm. No ischemic changes.  Recent labs: 05/01/2018: HbA1C >15.0%  01/29/2018; H/H 13/38. MCV 83. Platelets 226 Na 131.  Review of Systems   Constitution: Negative for decreased appetite, malaise/fatigue, weight gain and weight loss.  HENT: Negative for congestion.   Eyes: Negative for visual disturbance.       "Floaters"  Cardiovascular: Positive for chest pain and dyspnea on exertion. Negative for leg swelling, palpitations and syncope.       Elevated heart rate  Respiratory: Positive for shortness of breath.   Endocrine: Negative for cold intolerance.  Hematologic/Lymphatic: Does not bruise/bleed easily.  Skin: Negative for itching and rash.  Musculoskeletal: Negative for myalgias.  Gastrointestinal: Negative for abdominal pain, nausea and vomiting.  Genitourinary: Negative for dysuria.  Neurological: Positive for light-headedness. Negative for dizziness and weakness.  Psychiatric/Behavioral: The patient is not nervous/anxious.   All other systems reviewed and are  negative.        Vitals:   07/09/18 0901  BP: 131/82  Pulse: (!) 144   (Measured by the patient using a home BP monitor)   Observation/findings during video visit   Objective:    Physical Exam  Constitutional: She is oriented to person, place, and time. She appears well-developed and well-nourished. No distress.  Neck: No JVD present.  Pulmonary/Chest: Effort normal.  Musculoskeletal:        General: No edema.  Neurological: She is alert and oriented to person, place, and time.  Psychiatric: She has a normal mood and affect.  Nursing note and vitals reviewed.         Assessment & Recommendations:   23 y/o [place] American female with hypertension, hyperlipidemia, uncontrolled type 1 DM, referred for evaluation of tachycardia  1. Tachycardia Resting sinus tachycardia without signs of arrhythmias or ischemic changes. Secondary causes should be excluded. I have ordered TSH and free T4. While PE is in the differential, it would be uncommon to have persistent sinus tachycardia for two months. Will obtain echocardiogram to rule out  structural cardiac abnormality.  Will place on 24 Hr Holter monitor to assess  2. Mixed hyperlipidemia Started Crestor 20 mg. She is at high ASCVD risk due to her uncontrolled type 1 DM. Needs aggressive risk factor modification. Added Aspirin 81 mg daily. Hypertension management as below.   3. Essential hypertension Uncontrolled. Currently on amlodipine. Added lisinopril 5 mg daily. Will also help renal protection.   4. Uncontrolled diabetes mellitus type 1 without complications ([place]) This remains her primary comorbidity. Defer management to PCP. Consider ophthalmic evaluation given her "floaters" and uncotnrolled DM.   I will see her after the above tests.    Nigel Mormon, MD [facility] Pager: [phone] Office: [phone] If no answer Cell [phone]

## 2018-08-13 ENCOUNTER — Other Ambulatory Visit: Payer: Medicaid Other

## 2018-09-08 ENCOUNTER — Ambulatory Visit: Payer: Medicaid Other | Admitting: Cardiology

## 2018-09-15 ENCOUNTER — Other Ambulatory Visit: Payer: Self-pay

## 2018-09-15 ENCOUNTER — Ambulatory Visit: Payer: Medicaid Other

## 2018-09-15 ENCOUNTER — Ambulatory Visit (INDEPENDENT_AMBULATORY_CARE_PROVIDER_SITE_OTHER): Payer: Medicaid Other

## 2018-09-15 ENCOUNTER — Ambulatory Visit (INDEPENDENT_AMBULATORY_CARE_PROVIDER_SITE_OTHER): Payer: Medicaid Other | Admitting: Cardiology

## 2018-09-15 ENCOUNTER — Encounter: Payer: Self-pay | Admitting: Cardiology

## 2018-09-15 VITALS — BP 127/81 | HR 100 | Ht 60.0 in | Wt 163.0 lb

## 2018-09-15 DIAGNOSIS — E1065 Type 1 diabetes mellitus with hyperglycemia: Secondary | ICD-10-CM

## 2018-09-15 DIAGNOSIS — E782 Mixed hyperlipidemia: Secondary | ICD-10-CM

## 2018-09-15 DIAGNOSIS — R000 Tachycardia, unspecified: Secondary | ICD-10-CM

## 2018-09-15 DIAGNOSIS — I10 Essential (primary) hypertension: Secondary | ICD-10-CM | POA: Diagnosis not present

## 2018-09-15 DIAGNOSIS — IMO0001 Reserved for inherently not codable concepts without codable children: Secondary | ICD-10-CM

## 2018-09-15 NOTE — Progress Notes (Signed)
Virtual Visit via Video Note   Subjective:   [name] [name], female    DOB: 12-Jun-1995, 23 y.o.   MRN: [id number]  Chief complaint:  Palpitations  HPI  23 y.o. African American female with hypertension, hyperlipidemia, uncontrolled type 1 DM, referred for evaluation of palpitations.  Her symptoms have significantly improved since last visit. Her resting heart rate is much lower today. She denies any symptoms of palpitation.   Echocardiogram findings discussed with the patient.   Past Medical History:  Diagnosis Date  . Asthma    Per pt. dx many yrs. ago, does not use meds for asthma  . Cesarean delivery delivered 12/14/2017  . Diabetes type 1, uncontrolled ([place])   . Heart murmur   . Hypertension   . Migraines 10/08/2017  . Polyhydramnios affecting pregnancy 10/09/2017   Dx on 7/23. MVP 8.29  . Pyelonephritis affecting pregnancy in third trimester 11/19/2017     Past Surgical History:  Procedure Laterality Date  . CESAREAN SECTION N/A 12/13/2017   Procedure: CESAREAN SECTION;  Surgeon: Florian Buff, MD;  Location: [facility];  Service: Obstetrics;  Laterality: N/A;     Social History   Socioeconomic History  . Marital status: Single    Spouse name: Not on file  . Number of children: Not on file  . Years of education: Not on file  . Highest education level: Not on file  Occupational History  . Not on file  Social Needs  . Financial resource strain: Not on file  . Food insecurity    Worry: Not on file    Inability: Not on file  . Transportation needs    Medical: Not on file    Non-medical: Not on file  Tobacco Use  . Smoking status: Former Smoker    Types: Cigarettes  . Smokeless tobacco: Never Used  Substance and Sexual Activity  . Alcohol use: Not Currently  . Drug use: Not Currently  . Sexual activity: Not Currently  Lifestyle  . Physical activity    Days per week: Not on file    Minutes per session: Not on file  . Stress: Not on file   Relationships  . Social Herbalist on phone: Not on file    Gets together: Not on file    Attends religious service: Not on file    Active member of club or organization: Not on file    Attends meetings of clubs or organizations: Not on file    Relationship status: Not on file  . Intimate partner violence    Fear of current or ex partner: Not on file    Emotionally abused: Not on file    Physically abused: Not on file    Forced sexual activity: Not on file  Other Topics Concern  . Not on file  Social History Narrative  . Not on file     Family History  Problem Relation Age of Onset  . Diabetes Father   . Diabetes Maternal Grandmother   . Diabetes Paternal Grandmother   . Asthma Brother   . Heart disease Maternal Grandfather 38  . Stroke Maternal Grandfather 0     Current Outpatient Medications on File Prior to Visit  Medication Sig Dispense Refill  . ACCU-CHEK FASTCLIX LANCETS MISC 1 Units by Percutaneous route 4 (four) times daily. 100 each 12  . albuterol (VENTOLIN HFA) 108 (90 Base) MCG/ACT inhaler Inhale 1-2 puffs into the lungs as needed.    Marland Kitchen amLODipine (  NORVASC) 10 MG tablet Take 1 tablet by mouth daily.    Marland Kitchen aspirin EC 81 MG tablet Take 1 tablet (81 mg total) by mouth daily. 90 tablet 3  . Blood Glucose Monitoring Suppl (ACCU-CHEK NANO SMARTVIEW) w/Device KIT 1 kit by Subdermal route as directed. Check blood sugars for fasting, and two hours after breakfast, lunch and dinner (4 checks daily) 1 kit 0  . glucose blood (ACCU-CHEK SMARTVIEW) test strip Use as instructed to check blood sugars 100 each 12  . insulin aspart (NOVOLOG FLEXPEN) 100 UNIT/ML FlexPen Inject into the skin 4 (four) times daily as needed for high blood sugar (1 UNIT FOR EVERY 7 CARBS).     Marland Kitchen insulin glargine (LANTUS) 100 UNIT/ML injection Inject 0.5 mLs (50 Units total) into the skin at bedtime. 10 mL 0  . lisinopril (ZESTRIL) 5 MG tablet Take 1 tablet (5 mg total) by mouth daily. 30  tablet 3  . medroxyPROGESTERone (DEPO-PROVERA) 150 MG/ML injection Inject 150 mg into the muscle every 3 (three) months.    . rosuvastatin (CRESTOR) 20 MG tablet Take 1 tablet (20 mg total) by mouth at bedtime. 30 tablet 11   No current facility-administered medications on file prior to visit.     Cardiovascular studies:  EKG 09/15/2018: Sinus rhythm 84 pm. Normal EKG.  Echocardiogram 09/15/2018: Normal LV systolic function with EF 54%. Left ventricle cavity is normal in size. Normal global wall motion. Normal diastolic filling pattern. Calculated EF 54%. Mild tricuspid regurgitation.  No evidence of pulmonary hypertension.  EKG 06/30/2018: Sinus tachycardia 120 bpm. No ischemic changes.  Recent labs: 05/01/2018: HbA1C >15.0%  01/29/2018; H/H 13/38. MCV 83. Platelets 226 Na 131.  Review of Systems  Constitution: Negative for decreased appetite, malaise/fatigue, weight gain and weight loss.  HENT: Negative for congestion.   Eyes: Negative for visual disturbance.  Cardiovascular: Negative for chest pain, leg swelling, palpitations and syncope.  Respiratory: Negative for shortness of breath.   Endocrine: Negative for cold intolerance.  Hematologic/Lymphatic: Does not bruise/bleed easily.  Skin: Negative for itching and rash.  Musculoskeletal: Negative for myalgias.  Gastrointestinal: Negative for abdominal pain, nausea and vomiting.  Genitourinary: Negative for dysuria.  Neurological: Negative for dizziness, light-headedness and weakness.  Psychiatric/Behavioral: The patient is not nervous/anxious.   All other systems reviewed and are negative.        Vitals:   09/15/18 1605  BP: 127/81  Pulse: 100  SpO2: 98%   (Measured by the patient using a home BP monitor)   Observation/findings during video visit   Objective:    Physical Exam  Constitutional: She is oriented to person, place, and time. She appears well-developed and well-nourished. No distress.  HENT:   Head: Normocephalic and atraumatic.  Eyes: Pupils are equal, round, and reactive to light. Conjunctivae are normal.  Neck: No JVD present.  Cardiovascular: Normal rate, regular rhythm and intact distal pulses.  Pulmonary/Chest: Effort normal and breath sounds normal. She has no wheezes. She has no rales.  Abdominal: Soft. Bowel sounds are normal. There is no rebound.  Musculoskeletal:        General: No edema.  Lymphadenopathy:    She has no cervical adenopathy.  Neurological: She is alert and oriented to person, place, and time. No cranial nerve deficit.  Skin: Skin is warm and dry.  Psychiatric: She has a normal mood and affect.  Nursing note and vitals reviewed.         Assessment & Recommendations:   23 y/o African American  female with hypertension, hyperlipidemia, uncontrolled type 1 DM, referred for evaluation of tachycardia  1. Tachycardia Now resolved. Structurally normal heart on echocardiogram. No further cardiac workup necessary at this time.  2. Mixed hyperlipidemia Continue Aspirin, Crestor for primary prevention.  3. Essential hypertension Well controlled.  4. Uncontrolled type 1 DM: Emphasized importance of controlling diabetes.   I will see her on as needed basis.    Nigel Mormon, MD [facility] Pager: [phone] Office: [phone] If no answer Cell [phone]

## 2019-07-02 IMAGING — US US MFM OB DETAIL+14 WK
1 series · 13 of 28 positions shown · non-contrast
Comparison: none

[Series 1: us mfm ob detail+14 wk · 13 of 89 slices shown]
[im 4/89]
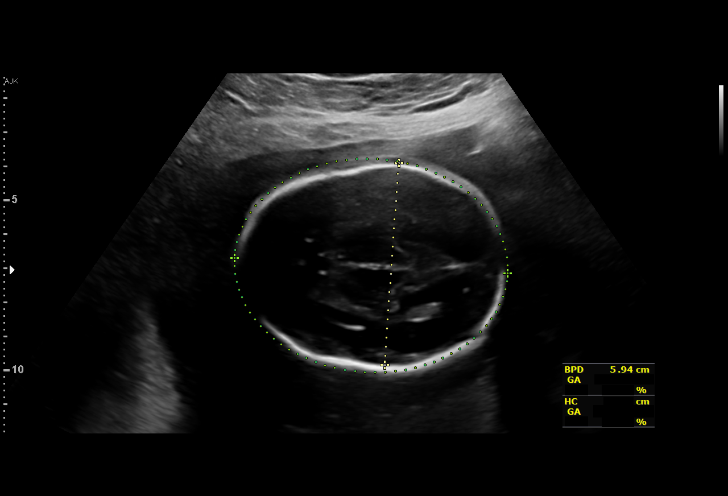
[im 10/89]
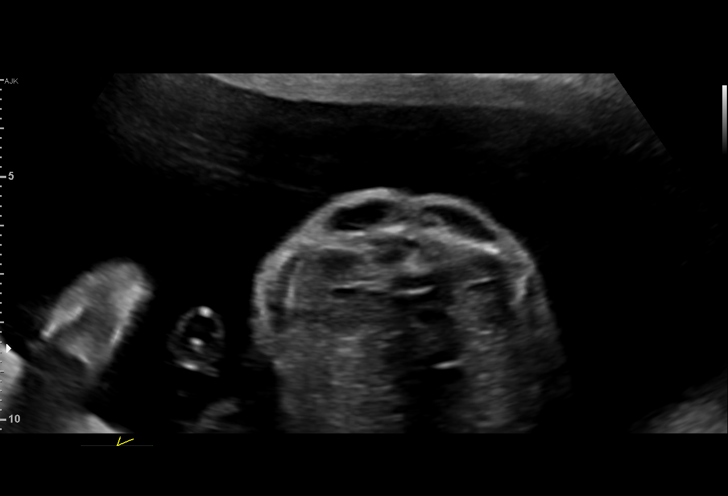
[im 17/89]
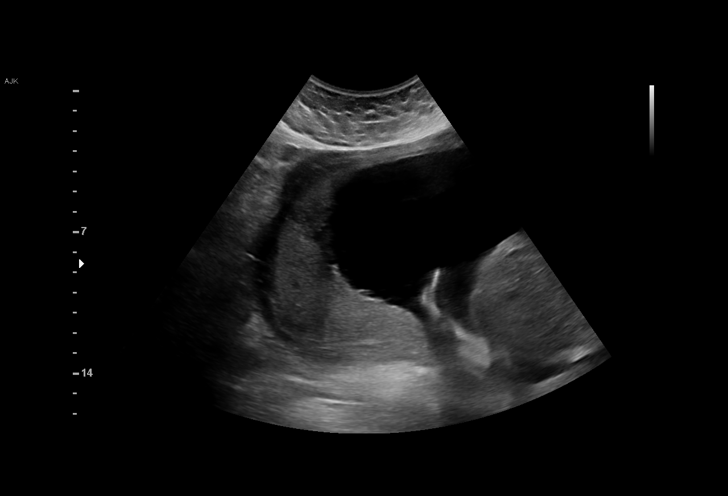
[im 23/89]
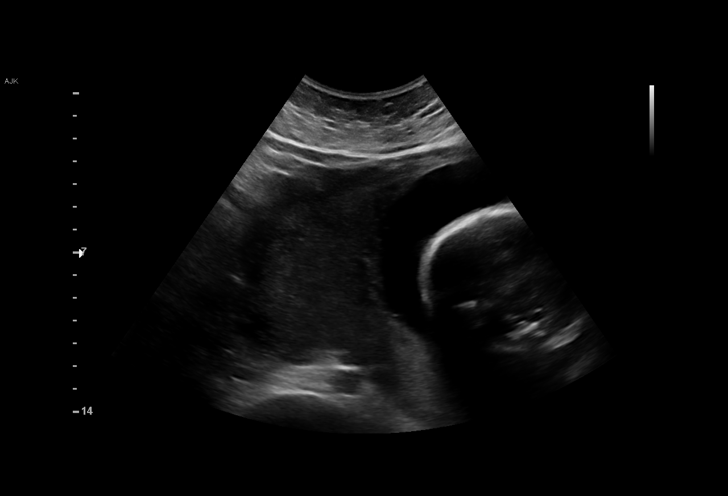
[im 30/89]
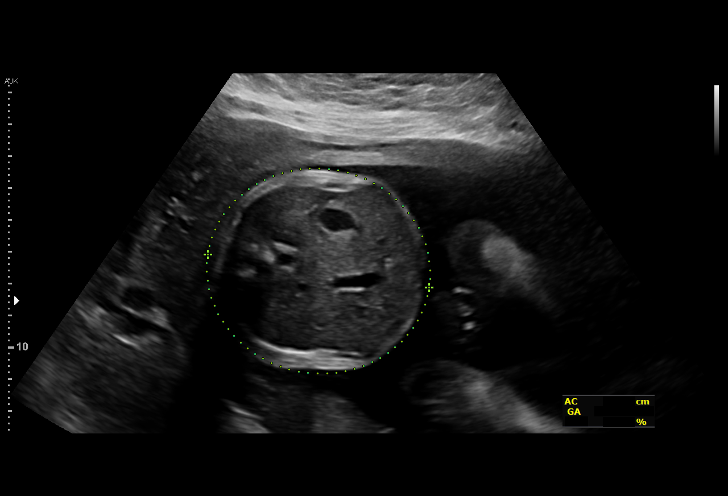
[im 36/89]
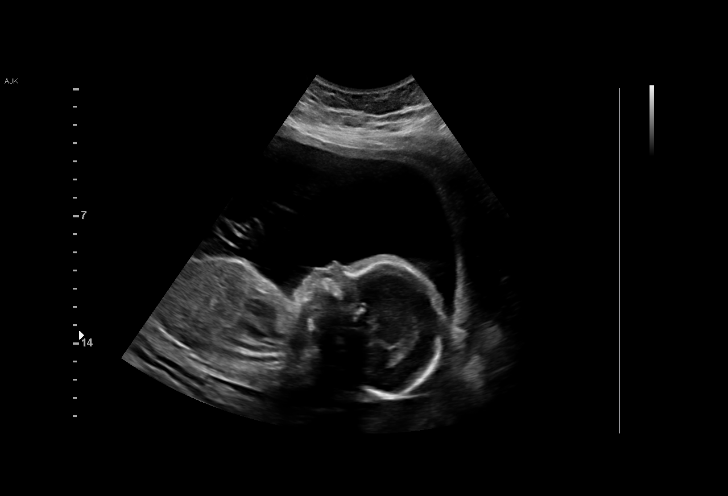
[im 46/89]
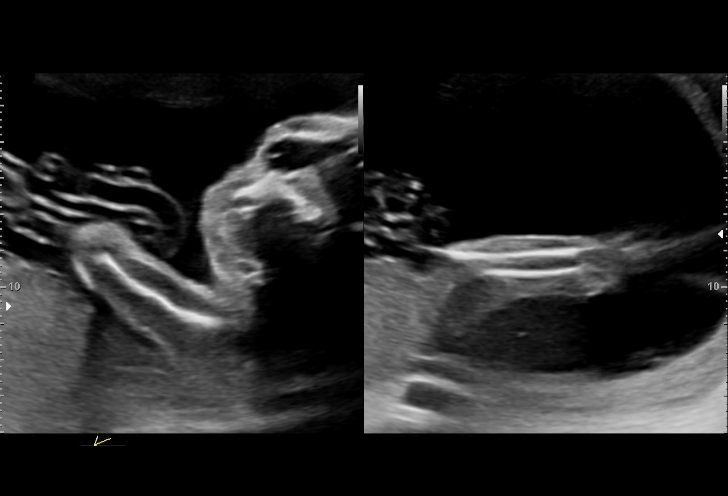
[im 53/89]
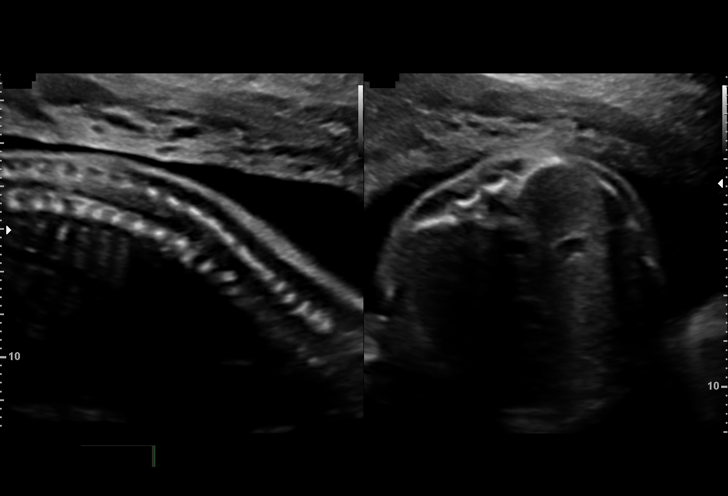
[im 59/89]
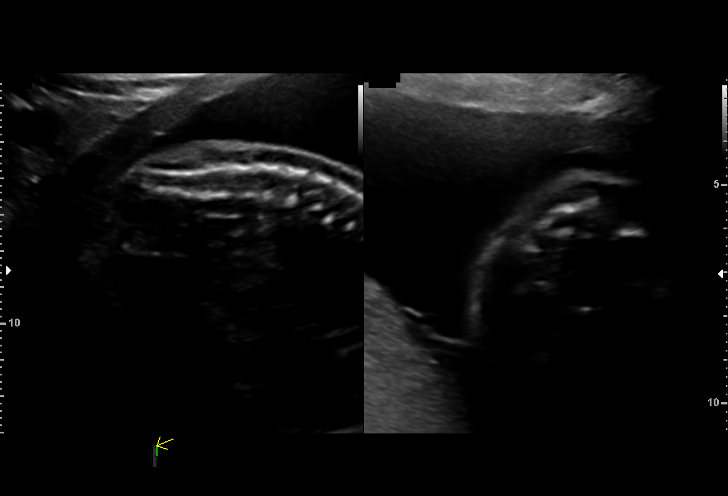
[im 66/89]
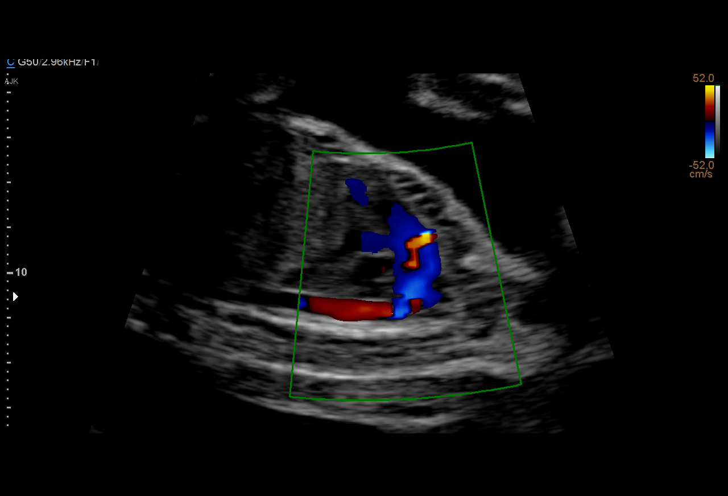
[im 72/89]
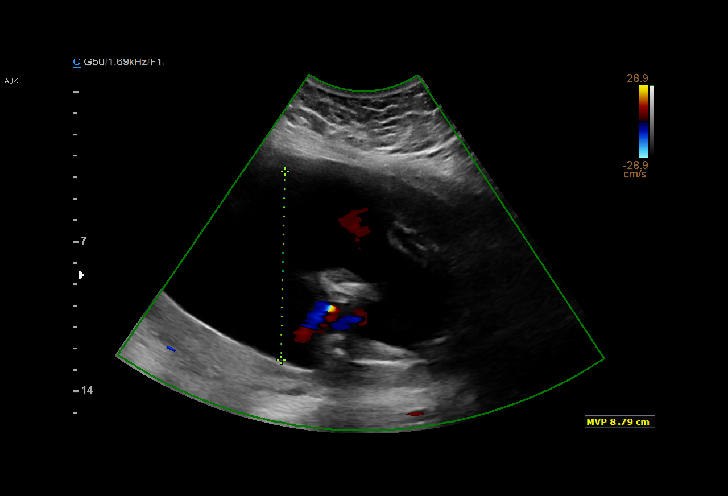
[im 79/89]
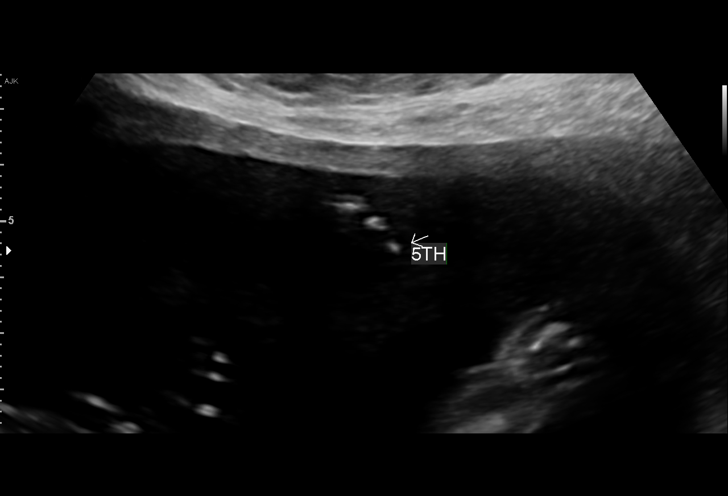
[im 85/89]
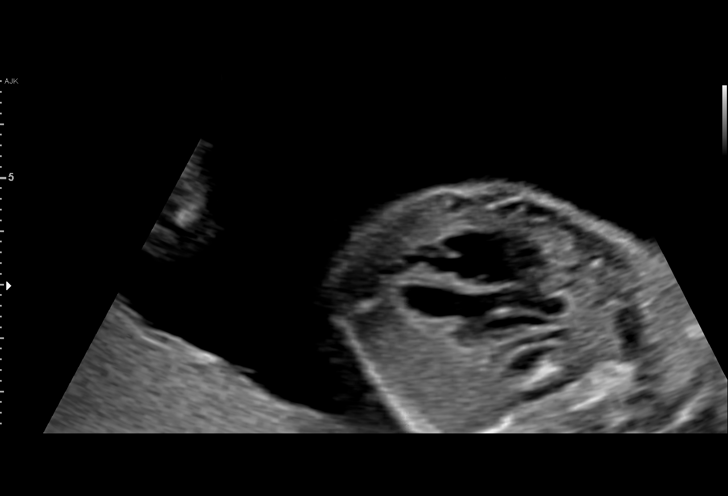

[13 of 28 positions shown; findings below may reference images not displayed]

1  COSME KRAEMER              [id number]      [id number]     [id number]
Indications

24 weeks gestation of pregnancy
Pre-existing diabetes, type 1, in pregnancy,
second trimester (poor control)
Encounter for antenatal screening for
malformations
Obesity complicating pregnancy, second
trimester
OB History

Blood Type:            Height:  5'0"   Weight (lb):  179       BMI:
Gravidity:    1
Fetal Evaluation

Num Of Fetuses:     1
Fetal Heart         142
Rate(bpm):
Cardiac Activity:   Observed
Presentation:       Variable
Placenta:           Posterior
P. Cord Insertion:  Visualized, central

Amniotic Fluid
AFI FV:      Polyhydramnios

Largest Pocket(cm)
8.29
Biometry

BPD:      59.7  mm     G. Age:  24w 3d         40  %    CI:        70.49   %    70 - 86
FL/HC:      18.3   %    18.7 -
HC:      226.7  mm     G. Age:  24w 5d         41  %    HC/AC:      1.07        1.05 -
AC:      212.1  mm     G. Age:  25w 6d         79  %    FL/BPD:     69.3   %    71 - 87
FL:       41.4  mm     G. Age:  23w 3d         12  %    FL/AC:      19.5   %    20 - 24
CER:      26.4  mm     G. Age:  24w 1d         42  %

Est. FW:     729  gm    1 lb 10 oz      58  %
Gestational Age

Clinical EDD:  24w 3d                                        EDD:   01/25/18
U/S Today:     24w 4d                                        EDD:   01/24/18
Best:          24w 3d     Det. By:  Clinical EDD             EDD:   01/25/18
Anatomy

Cranium:               Appears normal         Aortic Arch:            Appears normal
Cavum:                 Appears normal         Ductal Arch:            Appears normal
Ventricles:            Appears normal         Diaphragm:              Appears normal
Choroid Plexus:        Appears normal         Stomach:                Appears normal, left
sided
Cerebellum:            Appears normal         Abdomen:                Appears normal
Posterior Fossa:       Appears normal         Abdominal Wall:         Appears nml (cord
insert, abd wall)
Nuchal Fold:           Not applicable (>20    Cord Vessels:           Appears normal (3
wks GA)                                        vessel cord)
Face:                  Appears normal         Kidneys:                Appear normal
(orbits and profile)
Lips:                  Appears normal         Bladder:                Appears normal
Thoracic:              Appears normal         Spine:                  Appears normal
Heart:                 Appears normal         Upper Extremities:      Appears normal
(4CH, axis, and
situs)
RVOT:                  Appears normal         Lower Extremities:      Appears normal
LVOT:                  Appears normal

Other:  Heels, feet, open hands, and 5th digit visualized. Technically difficult
due to maternal habitus and fetal motion.
Cervix Uterus Adnexa

Cervix
Length:           4.42  cm.
Normal appearance by transabdominal scan.

Uterus
No abnormality visualized.

Left Ovary
Not visualized.

Right Ovary
Not visualized.

Adnexa:       No abnormality visualized.
Impression

Patient was admitted yesterday to the high risk pregnancy
unit with hyperglycemia.  She was first evaluated in the ED
where she checked and with complaints of dehydration.
Initial blood sugar was 350 mg/DL.  Patient was appropriately
treated with IV insulin.  She does not have clinical features of
DKA.  Her current blood glucose ranged from 120 to 160
mg/DL.  She is on Lantus insulin 28 units at bedtime and
NovoLog insulin with meals.

Patient has had diabetes for several years and was followed
by her endocrinologist at [facility].  She also reports she had
prenatal care at [facility].  She reports that she had normal fetal
echocardiography performed earlier this month.  We do not
have information on screening for aneuploidies.

Patient reports one episode of DKA in the past that followed
acute pyelonephritis.

We performed a fetal anatomical survey.  No markers of
aneuploidies or fetal structural defects are seen.  Cardiac
anatomy is normal.  Increased MVP consistent with
polyhydramnios is seen.

We discussed the following:
-Discussed the importance of good blood glucose control to
prevent adverse fetal and neonatal outcomes including
stillbirth, macrosomia, shoulder dystocia and both injuries,
neonatal respiratory distress syndrome.
-Diabetic ketoacidosis in pregnancy increases the risk of
maternal mortality.
-Discussed the importance of regular prenatal care and blood
glucose monitoring.
Recommendations

-Patient to make an appointment with an obstetrician before
discharge.  She plans to deliver in  [REDACTED].
-Fetal growth assessment every 4 weeks.
-Weekly antenatal testing from 32 weeks till delivery.

## 2019-07-31 IMAGING — US US FETAL BPP W/ NON-STRESS
1 series · 13 of 13 positions shown · non-contrast
Comparison: none

[Series 1: us fetal bpp w/nonstress · 13 acquisitions, 13 frames shown]
[im 1/13]
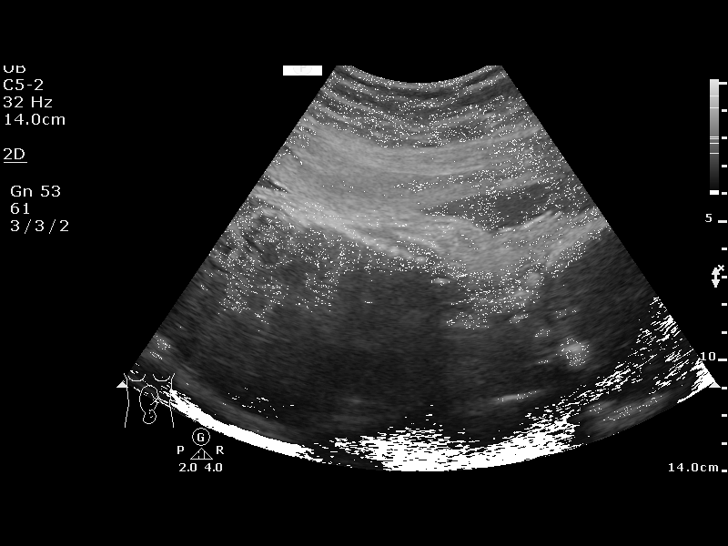
[im 2/13]
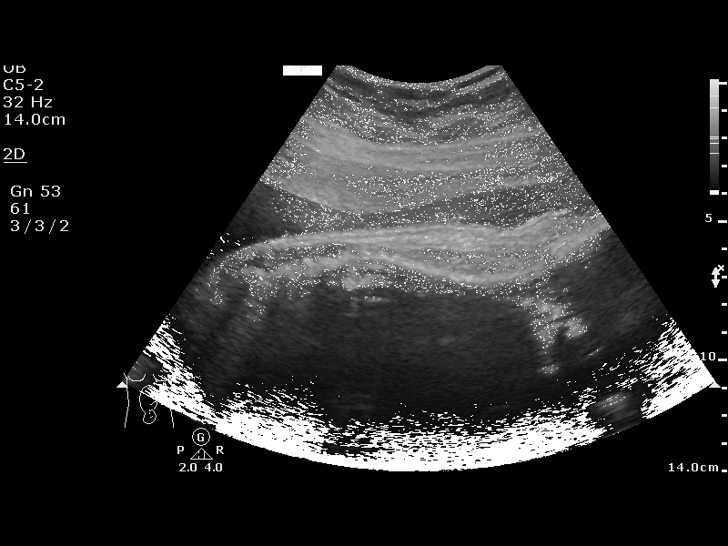
[im 3/13]
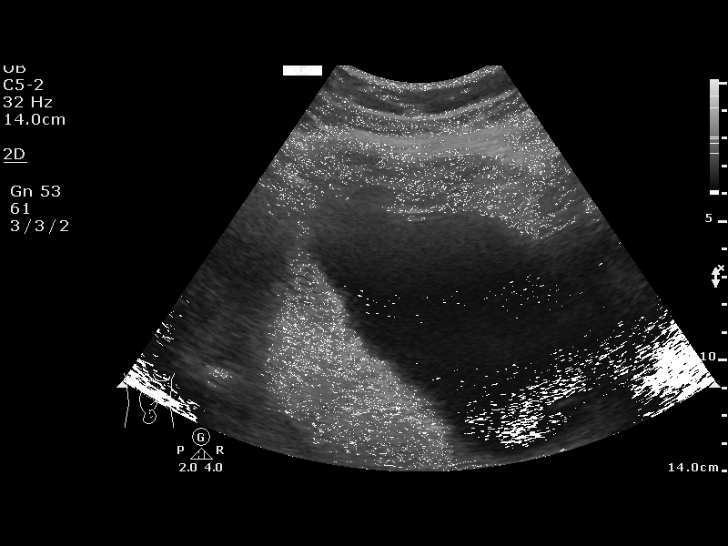
[im 4/13]
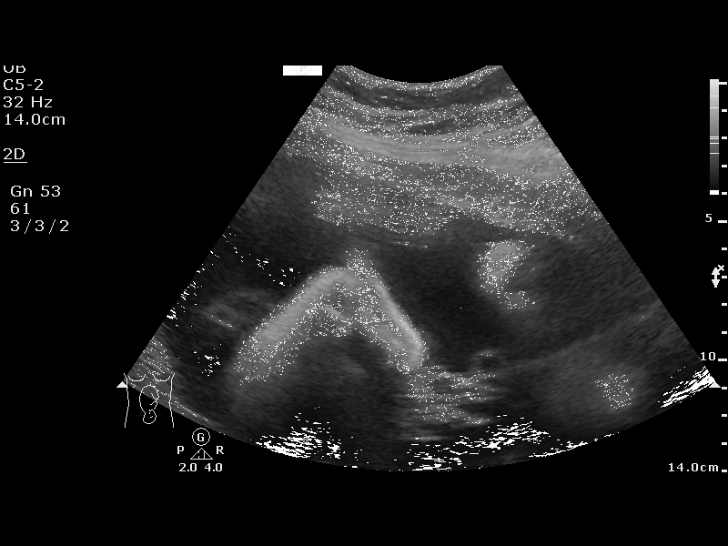
[im 5/13]
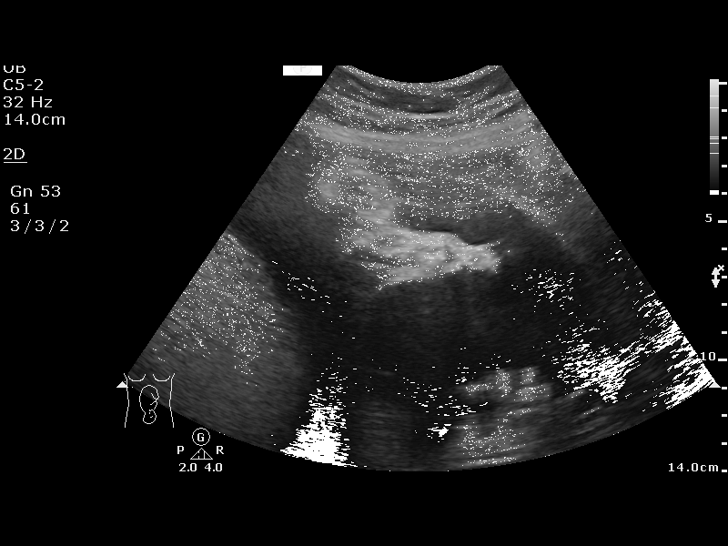
[im 6/13]
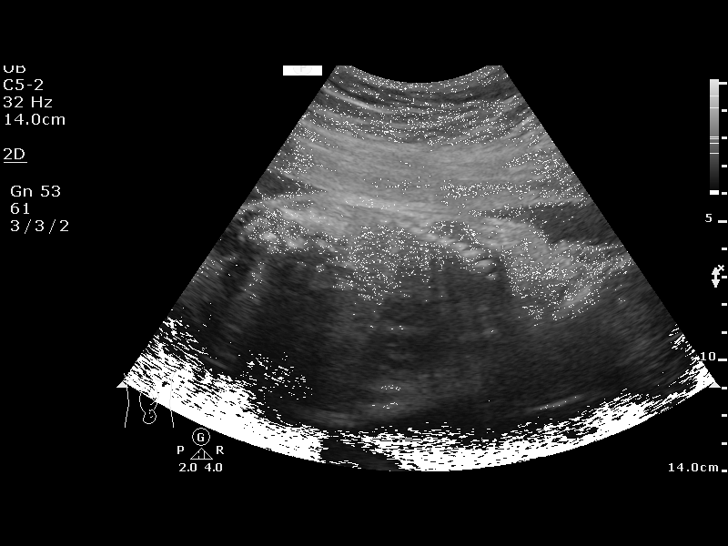
[im 7/13]
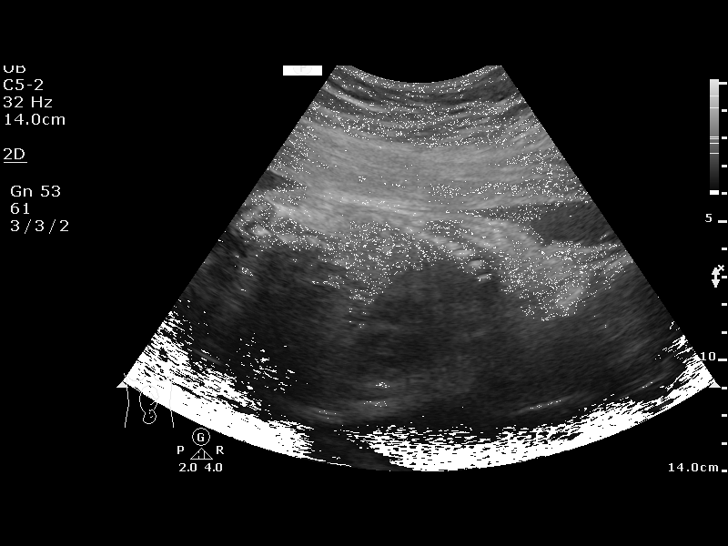
[im 8/13]
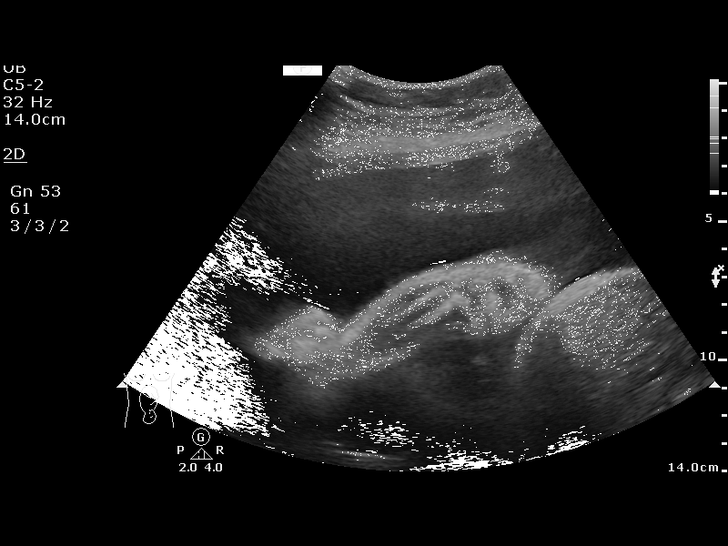
[im 9/13]
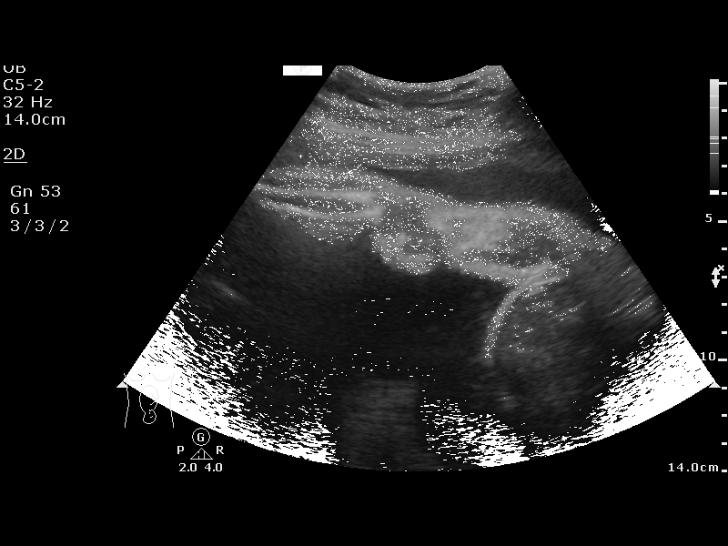
[im 10/13]
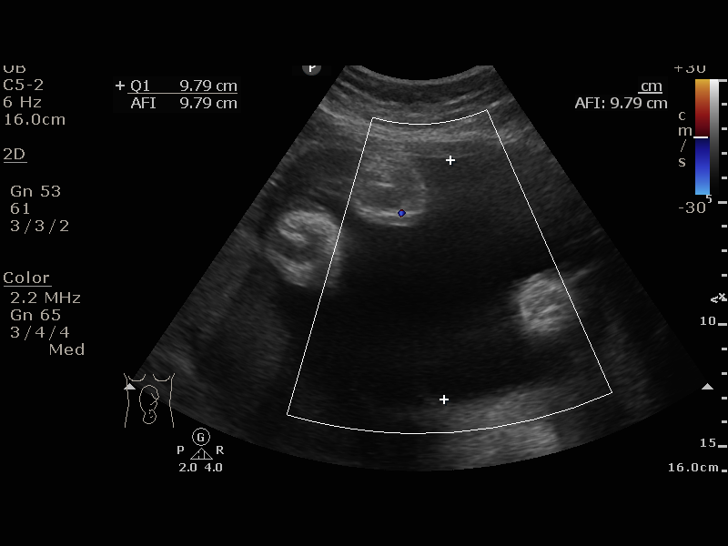
[im 11/13]
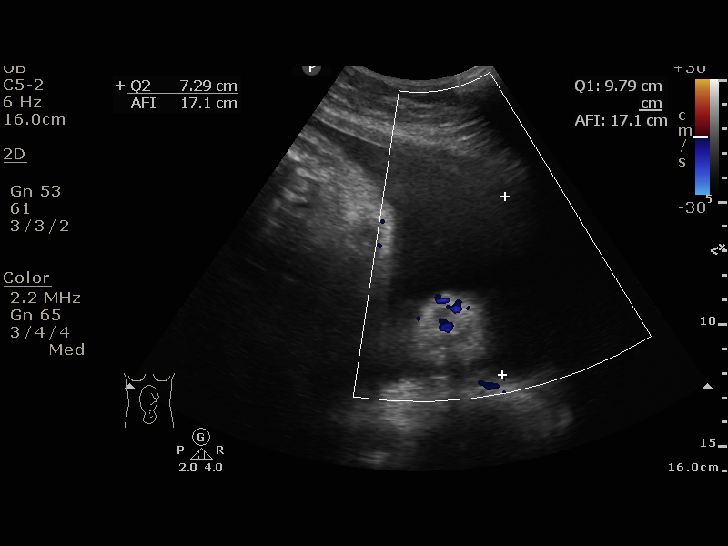
[im 12/13]
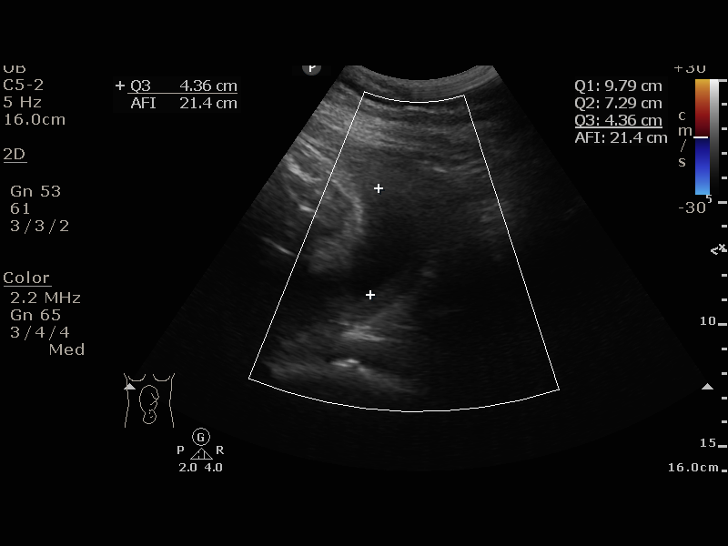
[im 13/13]
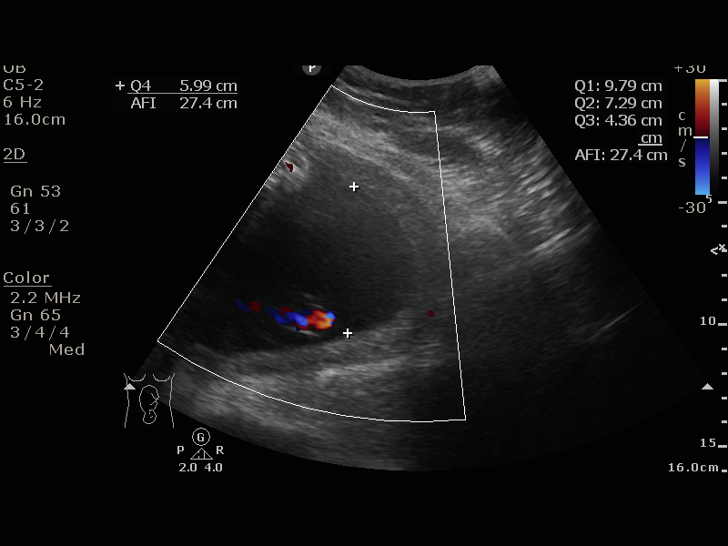

[13 of 13 positions shown; findings below may reference images not displayed]

[facility]
[REDACTED]

1  US FETAL BPP W/NONSTRESS             76818.4      BERNARD PLANAS

Service(s) Provided

Indications

28 weeks gestation of pregnancy
Pre-existing diabetes, type 1, in pregnancy,
third trimester
Polyhydramnios, third trimester, antepartum
condition or complication, unspecified fetus
Decreased fetal movements, third trimester,
unspecified
Vital Signs

Height:        5'0"
Fetal Evaluation

Num Of Fetuses:         1
Preg. Location:         Intrauterine
Cardiac Activity:       Observed
Presentation:           Cephalic

Amniotic Fluid
AFI FV:      Polyhydramnios

AFI Sum(cm)     %Tile       Largest Pocket(cm)
27.43           > 97

RUQ(cm)       RLQ(cm)       LUQ(cm)        LLQ(cm)
9.79

Comment:    Breathing noted intermittently, but not sustained.
Biophysical Evaluation

Amniotic F.V:   Pocket => 2 cm two         F. Tone:        Observed
planes
F. Movement:    Observed                   N.S.T:          Reactive
F. Breathing:   Not Observed               Score:          [DATE]
OB History

Gravidity:    1
Gestational Age

Clinical EDD:  28w 4d                                        EDD:   01/25/18
Best:          28w 4d     Det. By:  Clinical EDD             EDD:   01/25/18
Impression

BPP [DATE]. Polyhydramnios noted.
Recommendations

Cont weekly antenatal testing.

## 2019-08-29 IMAGING — US US FETAL BPP W/ NON-STRESS
1 series · 13 of 14 positions shown · non-contrast
Comparison: none

[Series 1: us fetal bpp w/nonstress · 14 acquisitions, 13 frames shown]
[im 1/14]
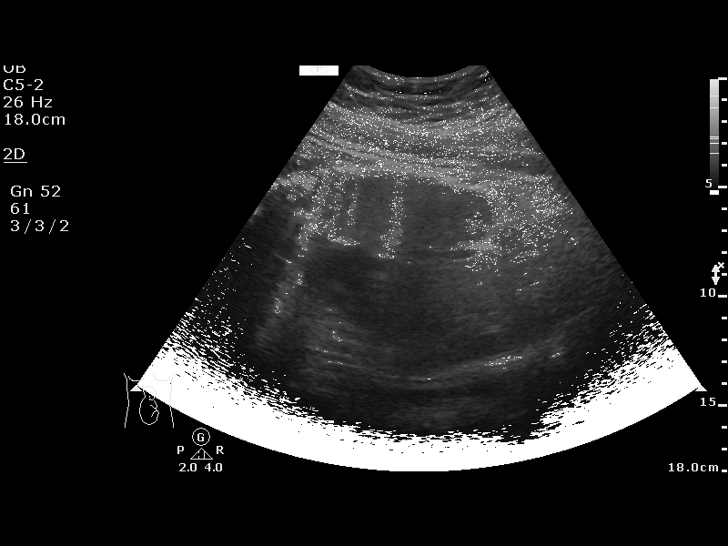
[im 2/14]
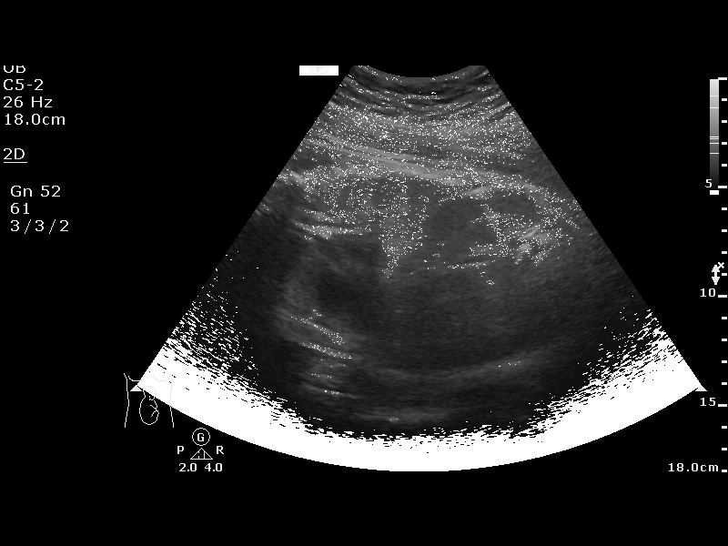
[im 3/14]
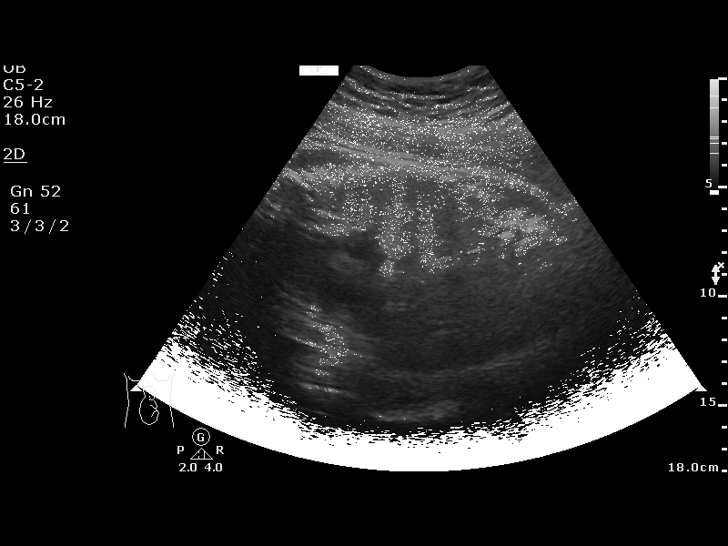
[im 4/14]
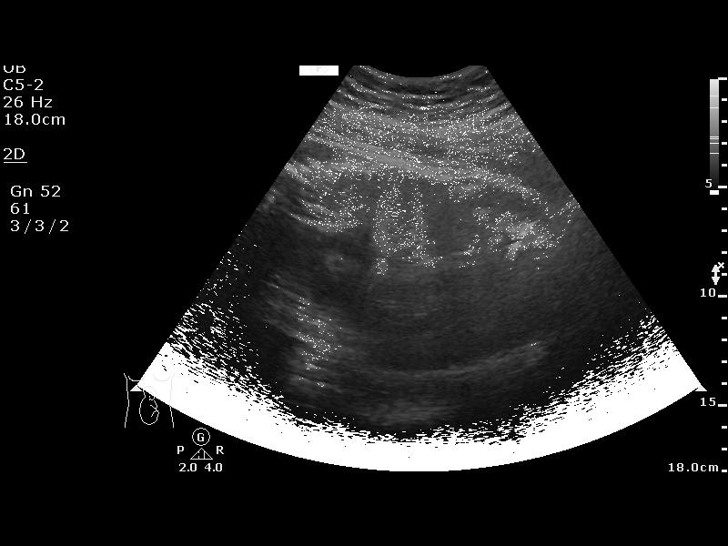
[im 5/14]
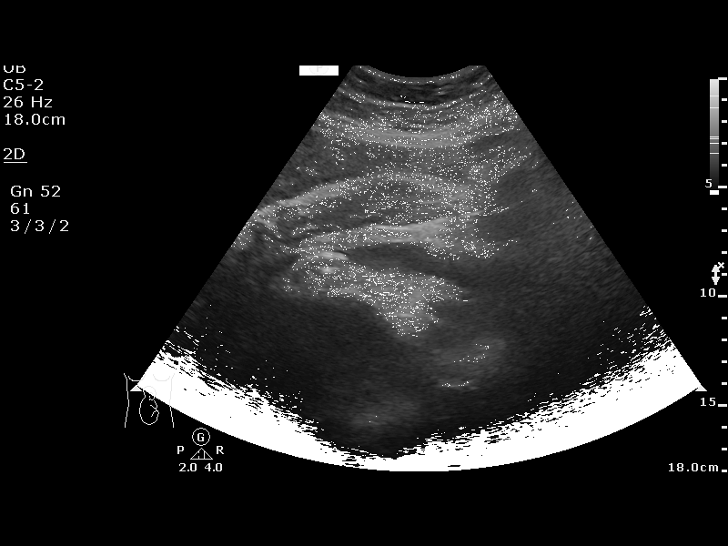
[im 6/14]
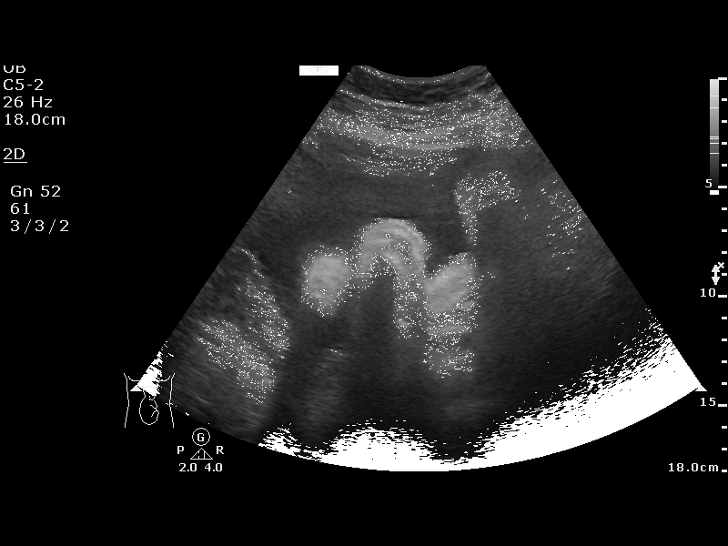
[im 8/14]
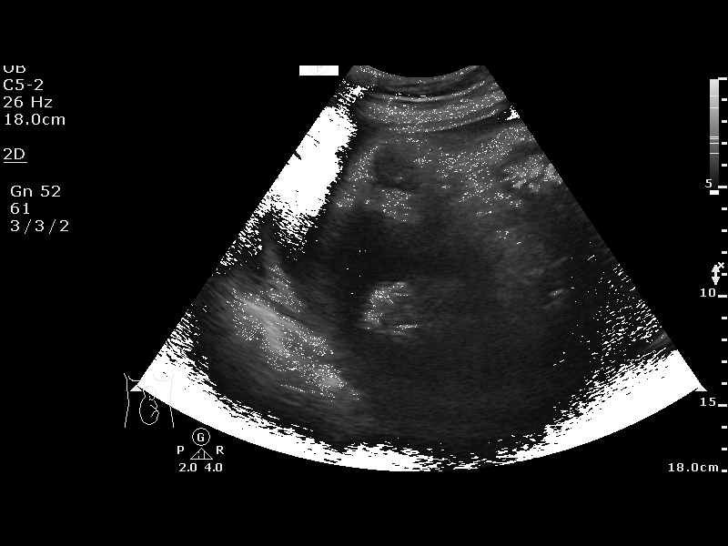
[im 9/14]
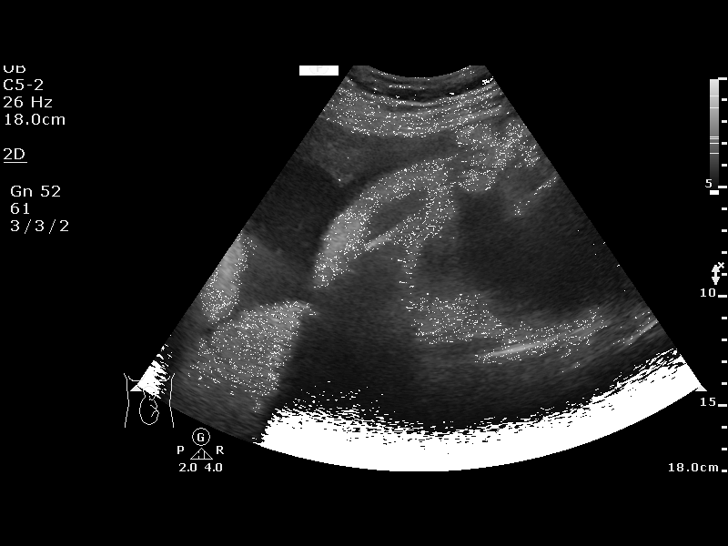
[im 10/14]
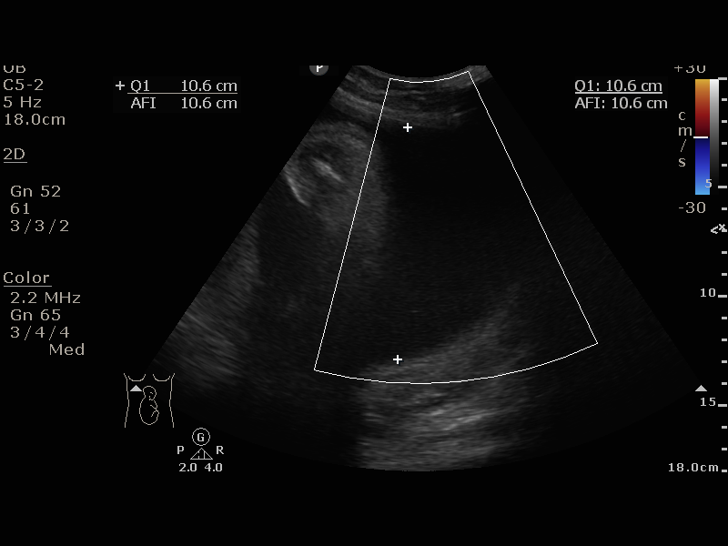
[im 11/14]
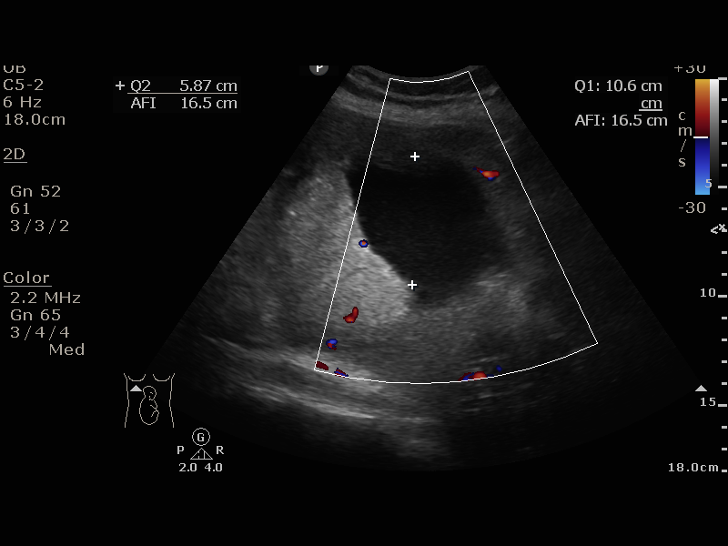
[im 12/14]
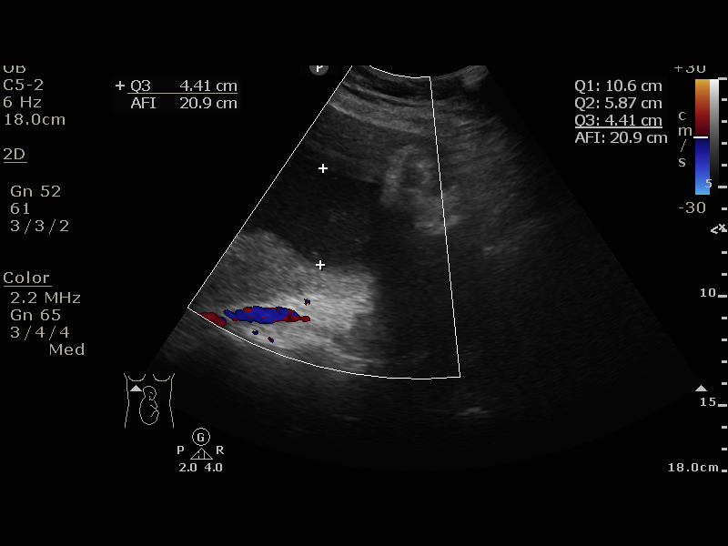
[im 13/14]
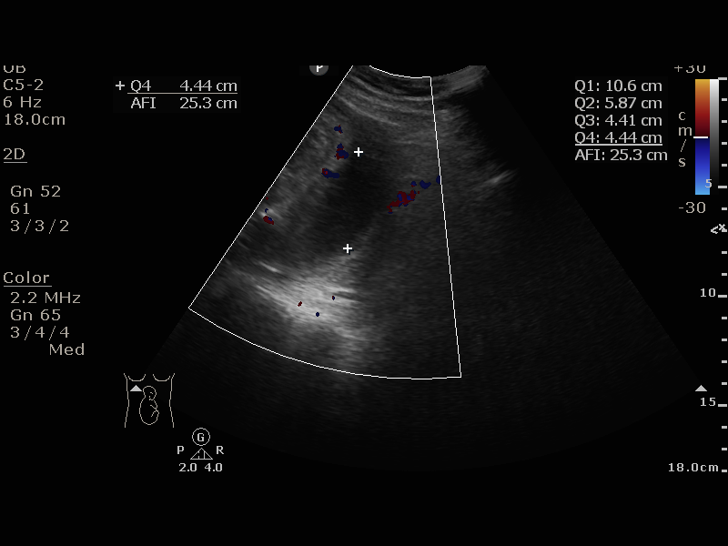
[im 14/14]
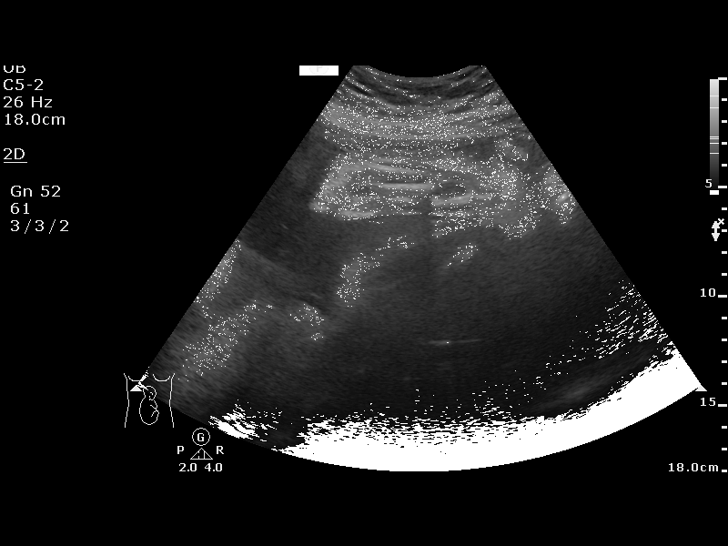

[13 of 14 positions shown; findings below may reference images not displayed]

[facility]
[REDACTED]

1  US FETAL BPP W/NONSTRESS             76818.4      VUAAL SOFTA

Service(s) Provided

Indications

32 weeks gestation of pregnancy
Pre-existing diabetes, type 1, in pregnancy,
third trimester
Polyhydramnios, third trimester, antepartum
condition or complication, unspecified fetus
Vital Signs

Height:        5'0"
Fetal Evaluation

Num Of Fetuses:         1
Preg. Location:         Intrauterine
Cardiac Activity:       Observed
Presentation:           Frank breech

Amniotic Fluid
AFI FV:      Polyhydramnios

AFI Sum(cm)     %Tile       Largest Pocket(cm)
25.[REDACTED]
RUQ(cm)       RLQ(cm)       LUQ(cm)        LLQ(cm)
10.6
Biophysical Evaluation

Amniotic F.V:   Pocket => 2 cm two         F. Tone:        Observed
planes
F. Movement:    Observed                   N.S.T:          Nonreactive
F. Breathing:   Observed                   Score:          [DATE]
OB History

Gravidity:    1
Gestational Age

Clinical EDD:  32w 5d                                        EDD:   01/25/18
Best:          32w 5d     Det. By:  Clinical EDD             EDD:   01/25/18
Impression

BPP [DATE], Non-reactive NST, but negative spontaneous CST.
Polyhydramnios, mild is noted.
Recommendations

Continue antepartum testing.

## 2019-09-06 IMAGING — US US MFM FETAL BPP W/O NON-STRESS
1 series · 15 of 24 positions shown · non-contrast
Comparison: none

[Series 1: us mfm fetal bpp w/o non-stress · 15 of 24 slices shown]
[im 1/24]
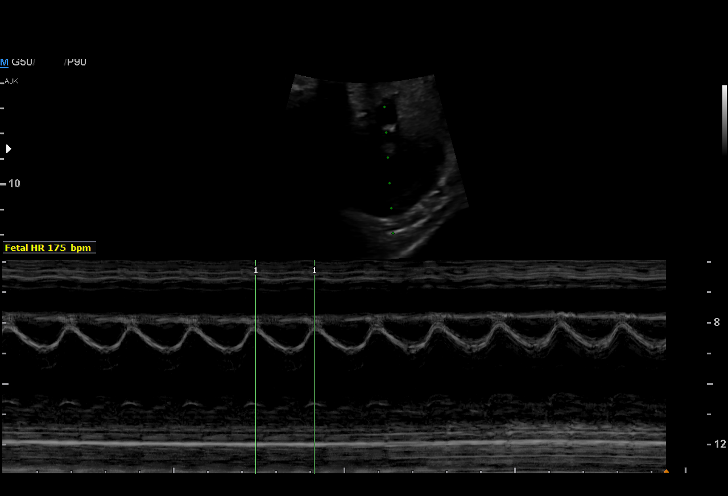
[im 3/24]
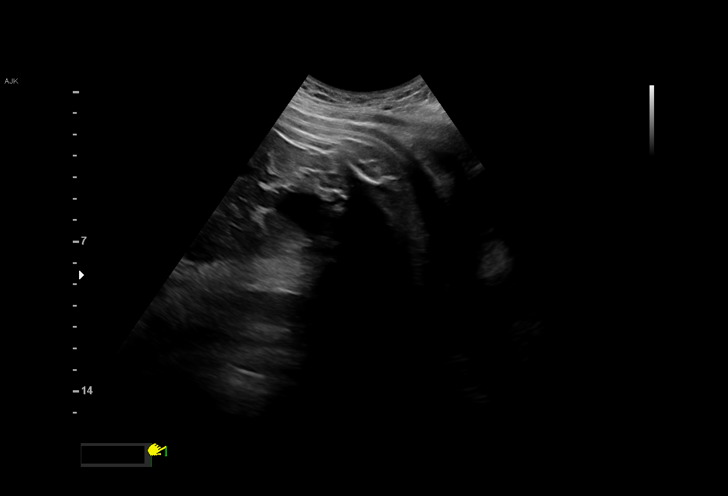
[im 5/24]
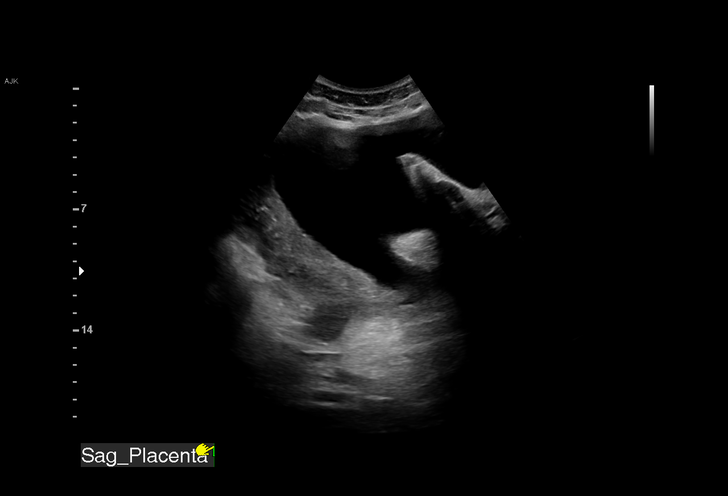
[im 6/24]
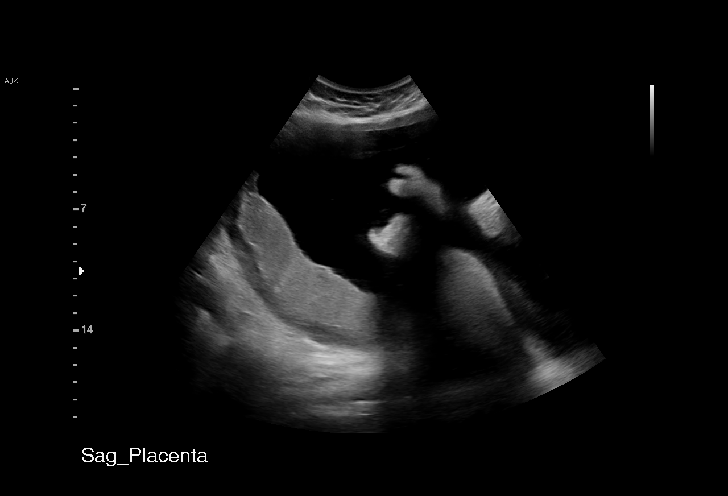
[im 8/24]
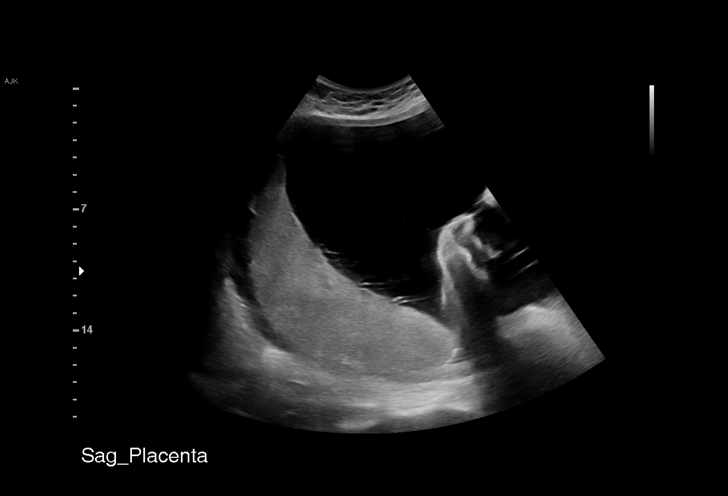
[im 9/24]
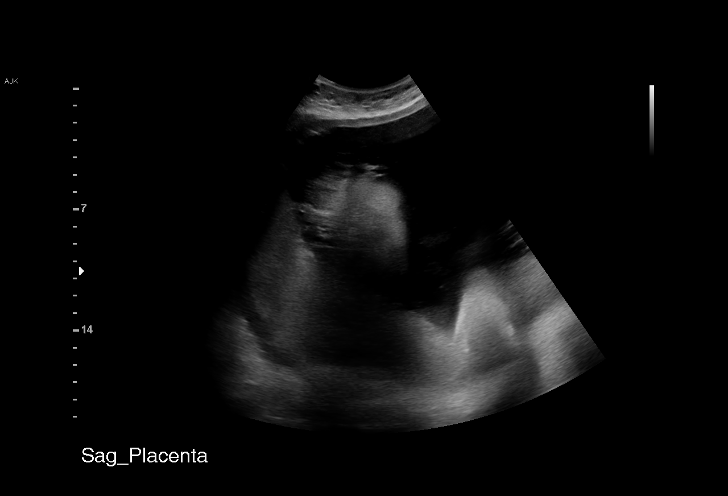
[im 11/24]
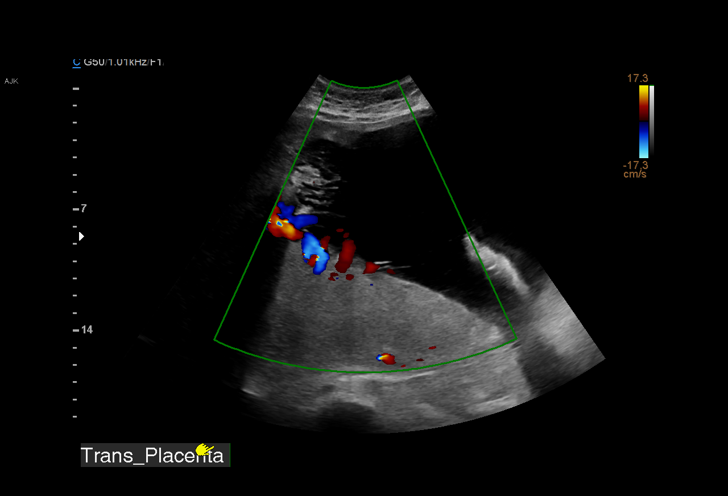
[im 13/24]
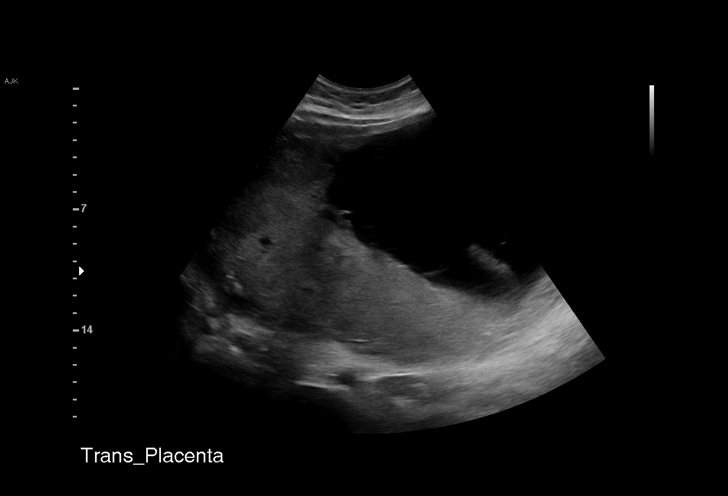
[im 14/24]
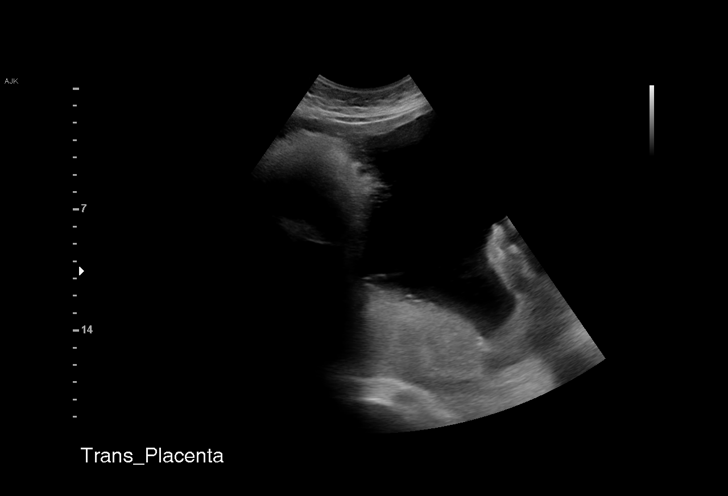
[im 16/24]
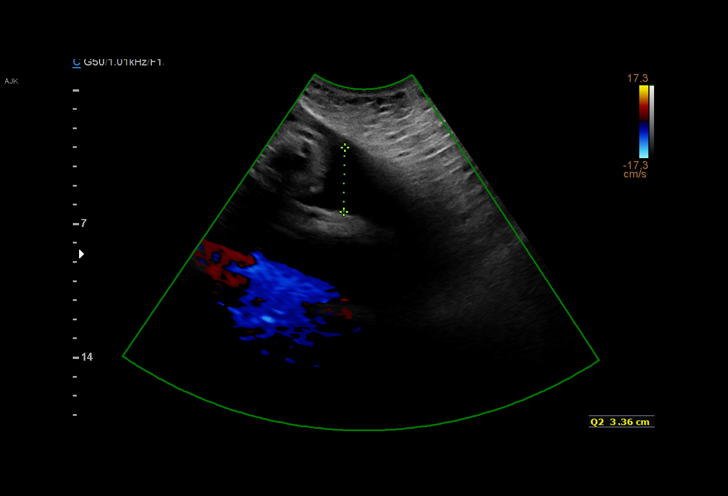
[im 17/24]
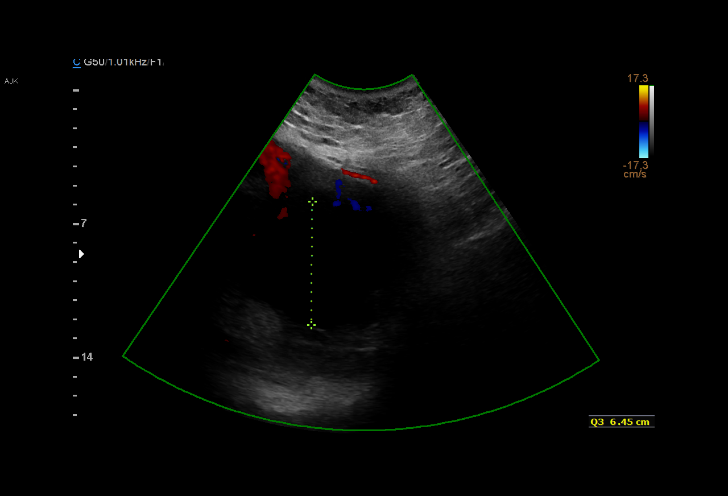
[im 19/24]
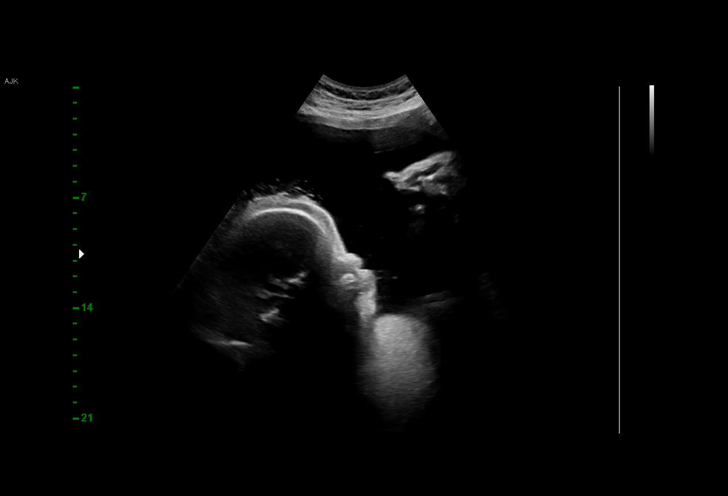
[im 21/24]
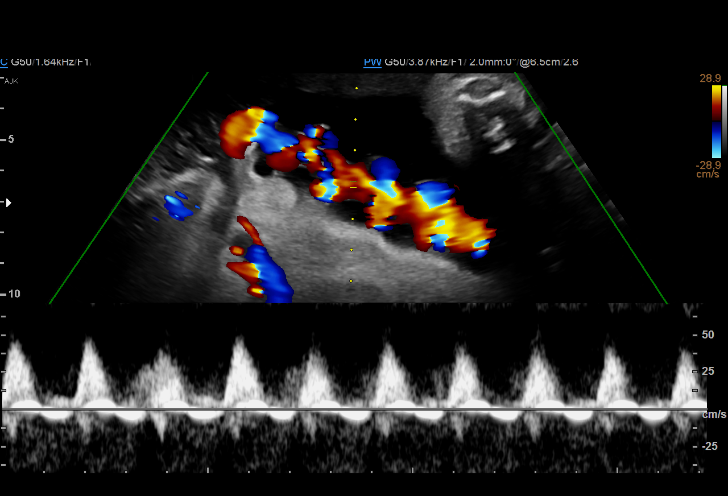
[im 22/24]
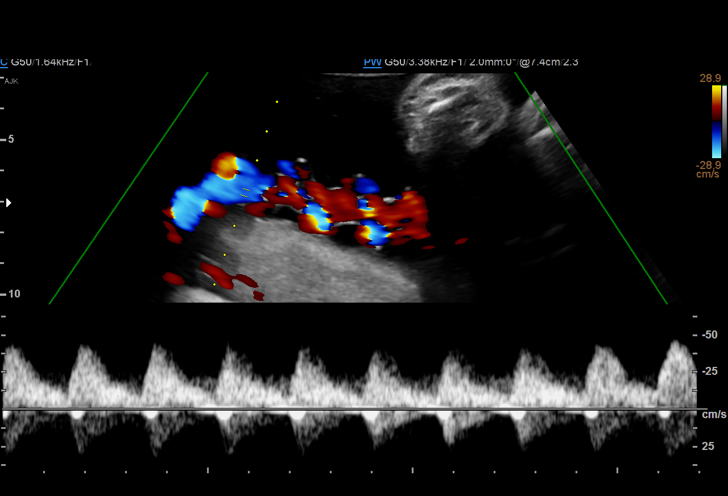
[im 24/24]
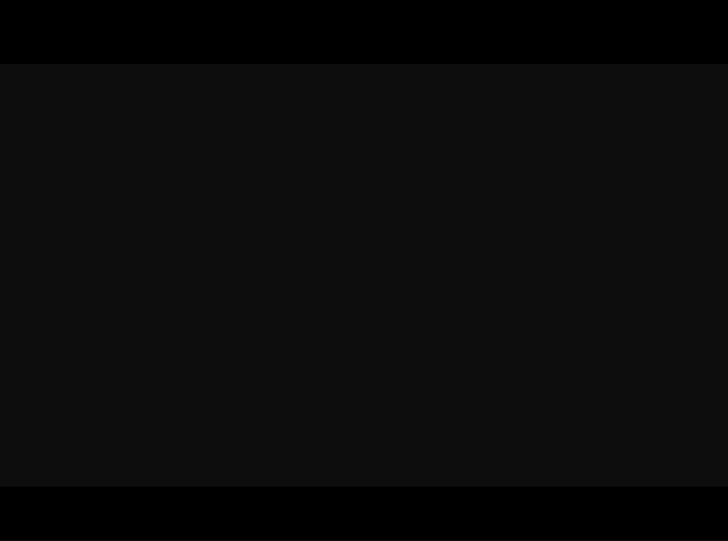

[15 of 24 positions shown; findings below may reference images not displayed]

OB/Gyn Clinic

Indications

Pre-existing diabetes, type 1, in pregnancy,
third trimester
33 weeks gestation of pregnancy
Vital Signs

Height:        5'0"
Fetal Evaluation

Num Of Fetuses:         1
Fetal Heart Rate(bpm):  171
Cardiac Activity:       Observed
Presentation:           Breech
Placenta:               Posterior

Amniotic Fluid
AFI FV:      Subjectively increased

AFI Sum(cm)     %Tile       Largest Pocket(cm)
21.[REDACTED]

RLQ(cm)       LUQ(cm)        LLQ(cm)
11.83
Biophysical Evaluation
Amniotic F.V:   Pocket => 2 cm two         F. Tone:        Not Observed
planes
F. Movement:    Not Observed               Score:          [DATE]
F. Breathing:   Not Observed
OB History

Gravidity:    1
Gestational Age

Clinical EDD:  33w 6d                                        EDD:   01/25/18
Best:          33w 6d     Det. By:  Clinical EDD             EDD:   01/25/18
Doppler - Fetal Vessels

Umbilical Artery
S/D     %tile
3.25       83

Impression

Patient returned for antenatal testing. She had BPP score of
[DATE] at your office yesterday. Patient reports that her
diabetes is not well controlled.

On today's ultrasound, amniotic fluid is normal. Fetal
movements, fetal breathing movements and tone did not
meet the criteria. Umbilical artery Doppler showed normal
diastolic flow.

Biophysical profile score of [DATE] (without NST) has a poor
prognosis and the risk of stillbirth, especially with poorly-
controlled diabetes.

I recommend delivery.

BRITANY (Evilasio Demetrio, CNM) was informed. Patient was
taken over to the BRITANY.

## 2019-09-08 ENCOUNTER — Encounter (HOSPITAL_COMMUNITY): Payer: Self-pay | Admitting: *Deleted

## 2019-09-08 ENCOUNTER — Emergency Department (HOSPITAL_COMMUNITY)
Admission: EM | Admit: 2019-09-08 | Discharge: 2019-09-09 | Disposition: A | Discharge status: Home / Self Care | Payer: Medicaid Other | Attending: Emergency Medicine | Admitting: Emergency Medicine

## 2019-09-08 ENCOUNTER — Other Ambulatory Visit: Payer: Self-pay

## 2019-09-08 DIAGNOSIS — J45909 Unspecified asthma, uncomplicated: Secondary | ICD-10-CM | POA: Insufficient documentation

## 2019-09-08 DIAGNOSIS — B372 Candidiasis of skin and nail: Secondary | ICD-10-CM | POA: Diagnosis not present

## 2019-09-08 DIAGNOSIS — Z79899 Other long term (current) drug therapy: Secondary | ICD-10-CM | POA: Insufficient documentation

## 2019-09-08 DIAGNOSIS — I10 Essential (primary) hypertension: Secondary | ICD-10-CM | POA: Insufficient documentation

## 2019-09-08 DIAGNOSIS — Z87891 Personal history of nicotine dependence: Secondary | ICD-10-CM | POA: Insufficient documentation

## 2019-09-08 DIAGNOSIS — E1065 Type 1 diabetes mellitus with hyperglycemia: Secondary | ICD-10-CM | POA: Diagnosis present

## 2019-09-08 DIAGNOSIS — Z794 Long term (current) use of insulin: Secondary | ICD-10-CM | POA: Insufficient documentation

## 2019-09-08 DIAGNOSIS — R739 Hyperglycemia, unspecified: Secondary | ICD-10-CM

## 2019-09-08 DIAGNOSIS — Z91040 Latex allergy status: Secondary | ICD-10-CM | POA: Insufficient documentation

## 2019-09-08 LAB — CBC WITH DIFFERENTIAL/PLATELET
Abs Immature Granulocytes: 0 10*3/uL (ref 0.00–0.07)
Basophils Absolute: 0 10*3/uL (ref 0.0–0.1)
Basophils Relative: 0 %
Eosinophils Absolute: 0.1 10*3/uL (ref 0.0–0.5)
Eosinophils Relative: 1 %
HCT: 40.1 % (ref 36.0–46.0)
Hemoglobin: 13.3 g/dL (ref 12.0–15.0)
Immature Granulocytes: 0 %
Lymphocytes Relative: 42 %
Lymphs Abs: 2.7 10*3/uL (ref 0.7–4.0)
MCH: 30.2 pg (ref 26.0–34.0)
MCHC: 33.2 g/dL (ref 30.0–36.0)
MCV: 90.9 fL (ref 80.0–100.0)
Monocytes Absolute: 0.4 10*3/uL (ref 0.1–1.0)
Monocytes Relative: 6 %
Neutro Abs: 3.3 10*3/uL (ref 1.7–7.7)
Neutrophils Relative %: 51 %
Platelets: 264 10*3/uL (ref 150–400)
RBC: 4.41 MIL/uL (ref 3.87–5.11)
RDW: 11.9 % (ref 11.5–15.5)
WBC: 6.4 10*3/uL (ref 4.0–10.5)
nRBC: 0 % (ref 0.0–0.2)

## 2019-09-08 LAB — BASIC METABOLIC PANEL
Anion gap: 9 (ref 5–15)
BUN: 15 mg/dL (ref 6–20)
CO2: 25 mmol/L (ref 22–32)
Calcium: 8.6 mg/dL — ABNORMAL LOW (ref 8.9–10.3)
Chloride: 103 mmol/L (ref 98–111)
Creatinine, Ser: 0.69 mg/dL (ref 0.44–1.00)
GFR calc Af Amer: >60 mL/min (ref >60)
GFR calc non Af Amer: >60 mL/min (ref >60)
Glucose, Bld: 314 mg/dL — ABNORMAL HIGH (ref 70–99)
Potassium: 3.9 mmol/L (ref 3.5–5.1)
Sodium: 137 mmol/L (ref 135–145)

## 2019-09-08 LAB — POC URINE PREG, ED: Preg Test, Ur: NEGATIVE

## 2019-09-08 LAB — URINALYSIS, ROUTINE W REFLEX MICROSCOPIC
Bacteria, UA: NONE SEEN
Bilirubin Urine: NEGATIVE
Glucose, UA: >=500 mg/dL — AB
Hgb urine dipstick: NEGATIVE
Ketones, ur: NEGATIVE mg/dL
Leukocytes,Ua: NEGATIVE
Nitrite: NEGATIVE
Protein, ur: NEGATIVE mg/dL
Specific Gravity, Urine: 1.015 (ref 1.005–1.030)
pH: 7 (ref 5.0–8.0)

## 2019-09-08 LAB — CBG MONITORING, ED
Glucose-Capillary: 296 mg/dL — ABNORMAL HIGH (ref 70–99)
Glucose-Capillary: 322 mg/dL — ABNORMAL HIGH (ref 70–99)

## 2019-09-08 LAB — WET PREP, GENITAL
Clue Cells Wet Prep HPF POC: NONE SEEN
Sperm: NONE SEEN
Trich, Wet Prep: NONE SEEN
Yeast Wet Prep HPF POC: NONE SEEN

## 2019-09-08 MED ORDER — SODIUM CHLORIDE 0.9 % IV BOLUS
1000.0000 mL | Freq: Once | INTRAVENOUS | Status: AC
  Administered 2019-09-08: 1000 mL via INTRAVENOUS

## 2019-09-08 MED ORDER — INSULIN ASPART 100 UNIT/ML ~~LOC~~ SOLN
8.0000 [IU] | Freq: Once | SUBCUTANEOUS | Status: AC
  Administered 2019-09-08: 8 [IU] via SUBCUTANEOUS
  Filled 2019-09-08: qty 1

## 2019-09-08 MED ORDER — INSULIN ASPART 100 UNIT/ML ~~LOC~~ SOLN
4.0000 [IU] | Freq: Once | SUBCUTANEOUS | Status: AC
  Administered 2019-09-08: 4 [IU] via SUBCUTANEOUS
  Filled 2019-09-08: qty 1

## 2019-09-08 NOTE — ED Notes (Signed)
Room setup for pelvic exam.

## 2019-09-08 NOTE — ED Triage Notes (Signed)
Pt states her blood sugar at home has been in the 500's.  Pt believes she has BV.  +discharge off white, yeast, and irritation with voiding.

## 2019-09-08 NOTE — ED Provider Notes (Signed)
[facility] Provider Note   CSN: [id number] Arrival date & time: 09/08/19  1513     History Chief Complaint  Patient presents with  . Hyperglycemia    [name] [name] is a 24 y.o. female with a history of type 1 diabetes, hypertension, asthma presenting with complaint of CBGs that have been significantly elevated in the 500 range.  She also describes vaginal discharge which is white in thin along with external perineal discomfort and itching.  She reports a history of bacterial vaginosis and yeast vaginitis which frequently will elevate her CBGs.  She endorses external irritation with urination but denies urinary frequency, also no back pain, nausea, vomiting fevers or chills.  She denies risk factors for STDs.  She has had no abdominal pain, appetite has been good.  She is on Lantus and sliding scale regular insulin and denies missing any doses of her medications.  The history is provided by the patient.       Past Medical History:  Diagnosis Date  . Asthma    Per pt. dx many yrs. ago, does not use meds for asthma  . Cesarean delivery delivered 12/14/2017  . Diabetes type 1, uncontrolled ([place])   . Heart murmur   . Hypertension   . Migraines 10/08/2017  . Polyhydramnios affecting pregnancy 10/09/2017   Dx on 7/23. 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 Pyelonephritis affecting pregnancy in third trimester 11/19/2017    Patient Active Problem List   Diagnosis Date Noted  . Mixed hyperlipidemia 07/09/2018  . Tachycardia 07/09/2018  . Essential hypertension 01/23/2018  . Fibroid 12/13/2017  . History of C-section 12/13/2017  . History of asthma 10/30/2017  . Severe obesity (BMI 35.0-39.9) with comorbidity ([place]) 10/08/2017  . Uncontrolled diabetes mellitus type 1 without complications 12/09/3005    Past Surgical History:  Procedure Laterality Date  . CESAREAN SECTION N/A 12/13/2017   Procedure: CESAREAN SECTION;  Surgeon: Florian Buff, MD;  Location: [facility];  Service:  Obstetrics;  Laterality: N/A;     OB History    Gravida  1   Para  1   Term  0   Preterm  1   AB  0   Living  1     SAB  0   TAB  0   Ectopic  0   Multiple  0   Live Births  1           Family History  Problem Relation Age of Onset  . Diabetes Father   . Diabetes Maternal Grandmother   . Diabetes Paternal Grandmother   . Asthma Brother   . Heart disease Maternal Grandfather 56  . Stroke Maternal Grandfather 0    Social History   Tobacco Use  . Smoking status: Former Smoker    Types: Cigarettes  . Smokeless tobacco: Never Used  Vaping Use  . Vaping Use: Never used  Substance Use Topics  . Alcohol use: Not Currently  . Drug use: Not Currently    Home Medications Prior to Admission medications   Medication Sig Start Date End Date Taking? Authorizing Provider  albuterol (VENTOLIN HFA) 108 (90 Base) MCG/ACT inhaler Inhale 1-2 puffs into the lungs every 6 (six) hours as needed for wheezing or shortness of breath.    Yes [provider]  amLODipine (NORVASC) 10 MG tablet Take 1 tablet by mouth daily. 07/08/18  Yes [provider]  insulin aspart (NOVOLOG FLEXPEN) 100 UNIT/ML FlexPen Inject 1-9 Units into the skin 4 (four)  times daily as needed for high blood sugar (1 UNIT FOR EVERY 7 CARBS).    Yes [provider]  insulin glargine (LANTUS) 100 UNIT/ML injection Inject 0.5 mLs (50 Units total) into the skin at bedtime. Patient taking differently: Inject 30 Units into the skin at bedtime.  03/24/18  Yes Carmin Muskrat, MD  medroxyPROGESTERone (DEPO-PROVERA) 150 MG/ML injection Inject 150 mg into the muscle every 3 (three) months. 01/10/18  Yes [provider]  Multiple Vitamin (MULTIVITAMIN WITH MINERALS) TABS tablet Take 1 tablet by mouth daily.   Yes [provider]  rosuvastatin (CRESTOR) 20 MG tablet Take 1 tablet (20 mg total) by mouth at bedtime. 07/09/18 09/08/19 Yes Patwardhan, Reynold Bowen, MD    nystatin-triamcinolone (MYCOLOG II) cream Apply to affected area twice daily 09/09/19   Evalee Jefferson, PA-C    Allergies    Latex  Review of Systems   Review of Systems  Constitutional: Negative for chills and fever.  HENT: Negative for congestion and sore throat.   Eyes: Negative.   Respiratory: Negative for chest tightness and shortness of breath.   Cardiovascular: Negative for chest pain.  Gastrointestinal: Negative for abdominal pain, nausea and vomiting.  Genitourinary: Positive for dysuria and vaginal discharge. Negative for urgency.  Musculoskeletal: Negative for arthralgias, back pain, joint swelling and neck pain.  Skin: Negative.  Negative for rash and wound.  Neurological: Negative for dizziness, weakness, light-headedness, numbness and headaches.  Psychiatric/Behavioral: Negative.   All other systems reviewed and are negative.   Physical Exam Updated Vital Signs BP 137/87 (BP Location: Right Arm)   Pulse 86   Temp 98.5 F (36.9 C) (Oral)   Resp 15   Ht 5' (1.524 m)   Wt 74.4 kg   SpO2 97%   BMI 32.03 kg/m   Physical Exam Vitals and nursing note reviewed. Exam conducted with a chaperone present.  Constitutional:      Appearance: She is well-developed.  HENT:     Head: Normocephalic and atraumatic.  Eyes:     Conjunctiva/sclera: Conjunctivae normal.  Cardiovascular:     Rate and Rhythm: Normal rate and regular rhythm.     Heart sounds: Normal heart sounds.  Pulmonary:     Effort: Pulmonary effort is normal.     Breath sounds: Normal breath sounds. No wheezing.  Abdominal:     General: Bowel sounds are normal.     Palpations: Abdomen is soft.     Tenderness: There is no abdominal tenderness.  Genitourinary:    Vagina: Vaginal discharge present.     Cervix: No cervical motion tenderness.     Uterus: Normal. Not tender.      Adnexa:        Right: No mass or tenderness.         Left: No mass or tenderness.       Comments: Thin white vaginal discharge.   No clumping.  External perineal erythema, dryness with excoriations present. Musculoskeletal:        General: Normal range of motion.     Cervical back: Normal range of motion.  Skin:    General: Skin is warm and dry.  Neurological:     Mental Status: She is alert.     ED Results / Procedures / Treatments   Labs (all labs ordered are listed, but only abnormal results are displayed) Labs Reviewed  WET PREP, GENITAL - Abnormal; Notable for the following components:      Result Value   WBC, Wet Prep  HPF POC RARE (*)    All other components within normal limits  BASIC METABOLIC PANEL - Abnormal; Notable for the following components:   Glucose, Bld 314 (*)    Calcium 8.6 (*)    All other components within normal limits  URINALYSIS, ROUTINE W REFLEX MICROSCOPIC - Abnormal; Notable for the following components:   Color, Urine STRAW (*)    Glucose, UA >=500 (*)    All other components within normal limits  CBG MONITORING, ED - Abnormal; Notable for the following components:   Glucose-Capillary 296 (*)    All other components within normal limits  CBG MONITORING, ED - Abnormal; Notable for the following components:   Glucose-Capillary 322 (*)    All other components within normal limits  CBG MONITORING, ED - Abnormal; Notable for the following components:   Glucose-Capillary 138 (*)    All other components within normal limits  CBC WITH DIFFERENTIAL/PLATELET  POC URINE PREG, ED  GC/CHLAMYDIA PROBE AMP ([place]) NOT AT [facility]    EKG None  Radiology No results found.  Procedures Procedures (including critical care time)  Medications Ordered in ED Medications  insulin aspart (novoLOG) injection 4 Units (4 Units Subcutaneous Given 09/08/19 2037)  sodium chloride 0.9 % bolus 1,000 mL (1,000 mLs Intravenous New Bag/Given 09/08/19 2245)  insulin aspart (novoLOG) injection 8 Units (8 Units Subcutaneous Given 09/08/19 2245)    ED Course  I have reviewed the triage vital signs  and the nursing notes.  Pertinent labs & imaging results that were available during my care of the patient were reviewed by me and considered in my medical decision making (see chart for details).    MDM Rules/Calculators/A&P                          Labs reviewed and discussed with patient.  She has no vaginal infection, specifically no BV, trichomonas or yeast infection.  She denies risk factors for STDs and she has no CMT.  Her gonorrhea and Chlamydia cultures are pending.  She was given a subcu dose of insulin aspart, for units per her sliding scale regimen.  This did not improve her glucose level at all.  She was then given a bolus of normal saline along with an additional subcu dose of aspart 8 units.  Recheck of her CBG  138.  She does have some external perineal irritation.  Prescribed Mycolog to cream for external perineal use.  Plan follow-up with her PCP as needed for persistent symptoms. Final Clinical Impression(s) / ED Diagnoses Final diagnoses:  Hyperglycemia  Yeast dermatitis    Rx / DC Orders ED Discharge Orders         Ordered    nystatin-triamcinolone (MYCOLOG II) cream     Discontinue  Reprint     09/09/19 0015           Evalee Jefferson, PA-C 09/09/19 0019    Long, Wonda Old[facility], MD 09/09/19 [phone]

## 2019-09-09 LAB — GC/CHLAMYDIA PROBE AMP (~~LOC~~) NOT AT ARMC
Chlamydia: NEGATIVE
Comment: NEGATIVE
Comment: NORMAL
Neisseria Gonorrhea: NEGATIVE

## 2019-09-09 LAB — CBG MONITORING, ED: Glucose-Capillary: 138 mg/dL — ABNORMAL HIGH (ref 70–99)

## 2019-09-09 MED ORDER — NYSTATIN-TRIAMCINOLONE 100000-0.1 UNIT/GM-% EX CREA
TOPICAL_CREAM | CUTANEOUS | 0 refills | Status: AC
Start: 2019-09-09 — End: ?

## 2019-09-09 NOTE — Discharge Instructions (Addendum)
As discussed your pelvic exam labs are negative tonight for obvious infection including no bacterial vaginosis or a yeast infection.  However given your irritation you have been prescribed a combination cream for external use only which should help treat external irritation and will also cover you for a skin yeast infection if this is present.  Apply twice daily after soap and water wash.  Plan to see your primary doctor for recheck if your symptoms persist or worsen.  Keep a close watch on your blood glucose levels.
